# Patient Record
Sex: Female | Born: 1995 | Hispanic: Yes | Marital: Married | Smoking: Never smoker
Health system: Southern US, Community
[De-identification: ages and names within clinical notes are randomized; demographics above are authoritative.]

## PROBLEM LIST (undated history)

## (undated) DIAGNOSIS — K8020 Calculus of gallbladder without cholecystitis without obstruction: Secondary | ICD-10-CM

## (undated) DIAGNOSIS — Z789 Other specified health status: Secondary | ICD-10-CM

## (undated) HISTORY — DX: Other specified health status: Z78.9

## (undated) HISTORY — DX: Morbid (severe) obesity due to excess calories: E66.01

---
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
## 2017-01-15 LAB — OB RESULTS CONSOLE GC/CHLAMYDIA
Chlamydia: NEGATIVE
Gonorrhea: NEGATIVE

## 2017-01-15 LAB — OB RESULTS CONSOLE GBS: STREP GROUP B AG: NEGATIVE

## 2017-02-08 ENCOUNTER — Inpatient Hospital Stay (HOSPITAL_COMMUNITY)
Admission: AD | Admit: 2017-02-08 | Discharge: 2017-02-08 | Disposition: A | Discharge status: Home / Self Care | Payer: Self-pay | Source: Ambulatory Visit | Attending: Obstetrics and Gynecology | Admitting: Obstetrics and Gynecology

## 2017-02-08 ENCOUNTER — Encounter (HOSPITAL_COMMUNITY): Payer: Self-pay

## 2017-02-08 DIAGNOSIS — Z3A40 40 weeks gestation of pregnancy: Secondary | ICD-10-CM | POA: Insufficient documentation

## 2017-02-08 DIAGNOSIS — O479 False labor, unspecified: Secondary | ICD-10-CM | POA: Insufficient documentation

## 2017-02-08 NOTE — Discharge Instructions (Signed)
Contracciones de Braxton Hicks °(Braxton Hicks Contractions) °Durante el embarazo, pueden presentarse contracciones uterinas que no siempre indican que está en trabajo de parto. °¿QUÉ SON LAS CONTRACCIONES DE BRAXTON HICKS? °Las contracciones que se presentan antes del trabajo de parto se conocen como contracciones de Braxton Hicks o falso trabajo de parto. Hacia el final del embarazo (32 a 34 semanas), estas contracciones pueden aparecen con más frecuencia y volverse más intensas. No corresponden al trabajo de parto verdadero porque estas contracciones no producen el agrandamiento (la dilatación) y el afinamiento del cuello del útero. Algunas veces, es difícil distinguirlas del trabajo de parto verdadero porque en algunos casos pueden ser muy intensas, y las personas tienen diferentes niveles de tolerancia al dolor. No debe sentirse avergonzada si concurre al hospital con falso trabajo de parto. En ocasiones, la única forma de saber si el trabajo de parto es verdadero es que el médico determine si hay cambios en el cuello del útero. °Si no hay problemas prenatales u otras complicaciones de salud asociadas con el embarazo, no habrá inconvenientes si la envían a su casa con falso trabajo de parto y espera que comience el verdadero. °CÓMO DIFERENCIAR EL TRABAJO DE PARTO FALSO DEL VERDADERO °Falso trabajo de parto  °· Las contracciones del falso trabajo de parto duran menos y no son tan intensas como las verdaderas. °· Generalmente son irregulares. °· A menudo, se sienten en la parte delantera de la parte baja del abdomen y en la ingle, °· y pueden desaparecer cuando camina o cambia de posición mientras está acostada. °· Las contracciones se vuelven más débiles y su duración es menor a medida que el tiempo transcurre. °· Por lo general, no se hacen progresivamente más intensas, regulares y cercanas entre sí como en el caso del trabajo de parto verdadero. °Verdadero trabajo de parto  °· Las contracciones del verdadero  trabajo de parto duran de 30 a 70 segundos, son muy regulares y suelen volverse más intensas, y aumenta su frecuencia. °· No desaparecen cuando camina. °· La molestia generalmente se siente en la parte superior del útero y se extiende hacia la zona inferior del abdomen y hacia la cintura. °· El médico podrá examinarla para determinar si el trabajo de parto es verdadero. El examen mostrará si el cuello del útero se está dilatando y afinando. °LO QUE DEBE RECORDAR °· Continúe haciendo los ejercicios habituales y siga otras indicaciones que el médico le dé. °· Tome todos los medicamentos como le indicó el médico. °· Concurra a las visitas prenatales regulares. °· Coma y beba con moderación si cree que está en trabajo de parto. °· Si las contracciones de Braxton Hicks le provocan incomodidad: °¨ Cambie de posición: si está acostada o descansando, camine; si está caminando, descanse. °¨ Siéntese y descanse en una bañera con agua tibia. °¨ Beba 2 o 3 vasos de agua. La deshidratación puede provocar contracciones. °¨ Respire lenta y profundamente varias veces por hora. °¿CUÁNDO DEBO BUSCAR ASISTENCIA MÉDICA INMEDIATA? °Solicite atención médica de inmediato si: °· Las contracciones se intensifican, se hacen más regulares y cercanas entre sí. °· Tiene una pérdida de líquido por la vagina. °· Tiene fiebre. °· Elimina mucosidad manchada con sangre. °· Tiene una hemorragia vaginal abundante. °· Tiene dolor abdominal permanente. °· Tiene un dolor en la zona lumbar que nunca tuvo antes. °· Siente que la cabeza del bebé empuja hacia abajo y ejerce presión en la zona pélvica. °· El bebé no se mueve tanto como solía. °Esta información no tiene como fin reemplazar el   consejo del médico. Asegúrese de hacerle al médico cualquier pregunta que tenga. °Document Released: 09/03/2005 Document Revised: 03/17/2016 Document Reviewed: 09/05/2013 °Elsevier Interactive Patient Education © 2017 Elsevier Inc. ° °

## 2017-02-08 NOTE — MAU Note (Signed)
Pt presents to MAU with ctx. Reports ctx started about 1000 and got stronger at 1200. Denies any bleeding or leaking of fluid. reports good fetal movement.

## 2017-02-08 NOTE — Progress Notes (Signed)
I have communicated with Jasmine Decker CNM and reviewed vital signs:  Vitals:   02/08/17 1407 02/08/17 1519  BP: 130/77 128/75  Pulse: 99 90  Resp: 18 18  Temp: 97.7 F (36.5 C)     Vaginal exam:  Dilation: 1.5 Effacement (%): 50 Cervical Position: Posterior Station: Ballotable Exam by:: Guadlupe SpanishL. Qusay Villada RN ,   Also reviewed contraction pattern and that non-stress test is reactive.  It has been documented that patient is  Having irregular contractions with no cervical change over 1 hours not indicating active labor.  Patient denies any other complaints.  Based on this report provider has given order for discharge.  A discharge diagnosis will be entered by a provider. Labor discharge instructions reviewed with patient.

## 2017-02-13 ENCOUNTER — Telehealth (HOSPITAL_COMMUNITY): Payer: Self-pay | Admitting: *Deleted

## 2017-02-13 ENCOUNTER — Encounter (HOSPITAL_COMMUNITY): Payer: Self-pay

## 2017-02-13 ENCOUNTER — Encounter (HOSPITAL_COMMUNITY): Payer: Self-pay | Admitting: *Deleted

## 2017-02-13 ENCOUNTER — Inpatient Hospital Stay (HOSPITAL_COMMUNITY)
Admission: AD | Admit: 2017-02-13 | Discharge: 2017-02-16 | DRG: 775 | Disposition: A | Discharge status: Home / Self Care | Payer: Medicaid Other | Source: Ambulatory Visit | Attending: Obstetrics & Gynecology | Admitting: Obstetrics & Gynecology

## 2017-02-13 DIAGNOSIS — O99214 Obesity complicating childbirth: Secondary | ICD-10-CM | POA: Diagnosis present

## 2017-02-13 DIAGNOSIS — Z68.41 Body mass index (BMI) pediatric, 85th percentile to less than 95th percentile for age: Secondary | ICD-10-CM | POA: Diagnosis not present

## 2017-02-13 DIAGNOSIS — Z9049 Acquired absence of other specified parts of digestive tract: Secondary | ICD-10-CM

## 2017-02-13 DIAGNOSIS — O480 Post-term pregnancy: Secondary | ICD-10-CM | POA: Diagnosis present

## 2017-02-13 DIAGNOSIS — Z3A40 40 weeks gestation of pregnancy: Secondary | ICD-10-CM

## 2017-02-13 LAB — CBC
HEMATOCRIT: 33.9 % — AB (ref 36.0–46.0)
Hemoglobin: 10.6 g/dL — ABNORMAL LOW (ref 12.0–15.0)
MCH: 24.7 pg — ABNORMAL LOW (ref 26.0–34.0)
MCHC: 31.3 g/dL (ref 30.0–36.0)
MCV: 79 fL (ref 78.0–100.0)
Platelets: 501 10*3/uL — ABNORMAL HIGH (ref 150–400)
RBC: 4.29 MIL/uL (ref 3.87–5.11)
RDW: 15.9 % — AB (ref 11.5–15.5)
WBC: 11.1 10*3/uL — ABNORMAL HIGH (ref 4.0–10.5)

## 2017-02-13 LAB — ABO/RH: ABO/RH(D): A POS

## 2017-02-13 LAB — TYPE AND SCREEN
ABO/RH(D): A POS
Antibody Screen: NEGATIVE

## 2017-02-13 MED ORDER — MISOPROSTOL 25 MCG QUARTER TABLET
25.0000 ug | ORAL_TABLET | ORAL | Status: DC | PRN
  Administered 2017-02-13: 25 ug via VAGINAL
  Filled 2017-02-13: qty 1
  Filled 2017-02-13: qty 0.25

## 2017-02-13 MED ORDER — LIDOCAINE HCL (PF) 1 % IJ SOLN
30.0000 mL | INTRAMUSCULAR | Status: DC | PRN
  Administered 2017-02-14: 30 mL via SUBCUTANEOUS
  Filled 2017-02-13: qty 30

## 2017-02-13 MED ORDER — OXYCODONE-ACETAMINOPHEN 5-325 MG PO TABS: 2.0000 | ORAL_TABLET | ORAL | Status: DC | PRN

## 2017-02-13 MED ORDER — TERBUTALINE SULFATE 1 MG/ML IJ SOLN
0.2500 mg | Freq: Once | INTRAMUSCULAR | Status: DC | PRN
  Filled 2017-02-13: qty 1

## 2017-02-13 MED ORDER — OXYCODONE-ACETAMINOPHEN 5-325 MG PO TABS: 1.0000 | ORAL_TABLET | ORAL | Status: DC | PRN

## 2017-02-13 MED ORDER — FLEET ENEMA 7-19 GM/118ML RE ENEM: 1.0000 | ENEMA | Freq: Every day | RECTAL | Status: DC | PRN

## 2017-02-13 MED ORDER — ACETAMINOPHEN 325 MG PO TABS
650.0000 mg | ORAL_TABLET | ORAL | Status: DC | PRN
  Administered 2017-02-14 (×2): 650 mg via ORAL
  Filled 2017-02-13 (×3): qty 2

## 2017-02-13 MED ORDER — ONDANSETRON HCL 4 MG/2ML IJ SOLN
4.0000 mg | Freq: Four times a day (QID) | INTRAMUSCULAR | Status: DC | PRN
  Administered 2017-02-14 (×2): 4 mg via INTRAVENOUS
  Filled 2017-02-13 (×2): qty 2

## 2017-02-13 MED ORDER — OXYTOCIN 40 UNITS IN LACTATED RINGERS INFUSION - SIMPLE MED: 2.5000 [IU]/h | INTRAVENOUS | Status: DC

## 2017-02-13 MED ORDER — SOD CITRATE-CITRIC ACID 500-334 MG/5ML PO SOLN: 30.0000 mL | ORAL | Status: DC | PRN

## 2017-02-13 MED ORDER — LACTATED RINGERS IV SOLN
INTRAVENOUS | Status: DC
  Administered 2017-02-13 – 2017-02-14 (×4): via INTRAVENOUS

## 2017-02-13 MED ORDER — OXYTOCIN BOLUS FROM INFUSION
500.0000 mL | Freq: Once | INTRAVENOUS | Status: AC
  Administered 2017-02-14: 500 mL via INTRAVENOUS

## 2017-02-13 MED ORDER — LACTATED RINGERS IV SOLN
500.0000 mL | INTRAVENOUS | Status: DC | PRN
  Administered 2017-02-14: 500 mL via INTRAVENOUS
  Administered 2017-02-14: 250 mL via INTRAVENOUS

## 2017-02-13 NOTE — Telephone Encounter (Signed)
Preadmission screen Interpreter number [phone]

## 2017-02-13 NOTE — H&P (Signed)
Lashan Mckinley Jewel is a 21 y.o. female G1P0 with IUP at [redacted]w[redacted]d presenting for NST after [facility]. Patient is scheduled for post dates induction tomorrow, March 10. Pt states she has been having irregular, every 5 minutes contractions, associated with none vaginal bleeding all day.  Membranes are intact, with active fetal movement.   [facility]  [facility]  Prenatal History/Complications:  Past Medical History: Past Medical History:  Diagnosis Date  . Gall stones     Past Surgical History: Past Surgical History:  Procedure Laterality Date  . CHOLECYSTECTOMY N/A 01/13/2015   Procedure: LAPAROSCOPIC CHOLECYSTECTOMY WITH POSSIBLE INTRAOPERATIVE CHOLANGIOGRAM;  Surgeon: Abigail Miyamoto, MD;  Location: [facility] OR;  Service: General;  Laterality: N/A;    Obstetrical History: OB History    Gravida Para Term Preterm AB Living   1             SAB TAB Ectopic Multiple Live Births                   Social History: Social History   Social History  . Marital status: Single    Spouse name: N/A  . Number of children: N/A  . Years of education: N/A   Social History Main Topics  . Smoking status: Never Smoker  . Smokeless tobacco: Never Used     Comment: Brother does smoke around the home, patient denies smoking/drugs/alcohol  . Alcohol use No  . Drug use: No  . Sexual activity: Yes     Comment: LMP 10/05/13   Other Topics Concern  . None   Social History Narrative   Lives at home with 38 year old brother, mother, no pets.  Patient's only hospital visit includes a trip to the ED for decreased po intake and swelling in her throat, no admission.  This was at 21 years of age.    Family History: Family History  Problem Relation Age of Onset  . Gallbladder disease Maternal Aunt     3 aunts have had gallbladder removal    Allergies: Allergies  Allergen Reactions  . Tylenol [Acetaminophen]     Liver concerns    Prescriptions Prior to Admission   Medication Sig Dispense Refill Last Dose  . Prenatal Vit-Fe Fumarate-FA (PRENATAL MULTIVITAMIN) TABS tablet Take 1 tablet by mouth daily at 12 noon.   02/08/2017 at Unknown time        Review of Systems   Constitutional: Negative for fever and chills Eyes: Negative for visual disturbances Respiratory: Negative for shortness of breath, dyspnea Cardiovascular: Negative for chest pain or palpitations  Gastrointestinal: Negative for vomiting, diarrhea and constipation.  POSITIVE for abdominal pain (contractions) Genitourinary: Negative for dysuria and urgency Musculoskeletal: Negative for back pain, joint pain, myalgias  Neurological: Negative for dizziness and headaches      Blood pressure 123/74, pulse 102, temperature 98 F (36.7 C), temperature source Oral, resp. rate 18, last menstrual period 05/17/2016, SpO2 100 %. General appearance: alert, cooperative and no distress Lungs: clear to auscultation bilaterally Heart: regular rate and rhythm Abdomen: soft, non-tender; bowel sounds normal Extremities: Homans sign is negative, no sign of DVT DTR's 2+ Presentation: cephalic Fetal monitoring  Baseline: 150 bpm; present accels, occasional variables with contractions, moderate variability.  Uterine activity  Date/time of onset: 0600, mild (6/10),  Cervical exam: 2-3; 50%, midline     Prenatal labs: ABO, Rh: A/Positive/-- (09/11 0000) Antibody: Negative (09/11 0000) Rubella: !Error! UNKNOWN RPR: Nonreactive (09/11 0000)  HBsAg: Negative (  09/11 0000)  HIV: Non-reactive (09/11 0000)  GBS: Negative (02/08 0000)  1 hr Glucola passed Genetic screening  declined Anatomy US normal  Prenatal Transfer Tool  Maternal Diabetes: No Genetic Screening: Declined Maternal Ultrasounds/Referrals: Normal Fetal Ultrasounds or other Referrals:  None Maternal Substance Abuse:  No Significant Maternal Medications:  None Significant Maternal Lab Results: None     No results found for  this or any previous visit (from the past 24 hour(s)).  Assessment: Breta Mckinley JewelGarcia-Sanchez is a 21 y.o. G1P0 with an IUP at 9392w6d [facility] She was found to be 2-3 cm in MAu with variable decelerations. Given that patient is scheduled for postdates induction, patient to be admitted for labor.   Plan: #Labor: expectant management #Pain:  Per request epidural #FWB Cat 1;  #ID: GBS: negative #MOF:  breast #MOC: Depo #Circ: female   Marylene LandKathryn Lorraine Sharita Bienaime  CNM 02/13/2017, 3:24 PM

## 2017-02-13 NOTE — Progress Notes (Signed)
Residents on unit.  Updated on pt status.  No new orders.

## 2017-02-13 NOTE — Progress Notes (Signed)
S:  Patient comfortable with current contractions   O:  VS: Blood pressure 122/65, pulse 84, temperature 98.2 F (36.8 C), temperature source Oral, resp. rate 18, height 5\' 2"  (1.575 m), weight 102.1 kg (225 lb), last menstrual period 05/17/2016, SpO2 100 %.        FHR : baseline 130 / variability mod / accelerations present / no decelerations        Toco: contractions every 4-6 minutes / mod         Dilation: 1.5 Effacement (%): 60 Station: -2 Presentation: Vertex Exam by:: Veronica Rogers,Student Midwife         Membranes: intact  A: IOL postdates      FHR category 1  P: Foley bulb placed, continue cytotec doses      Changed to regular diet     Ambien to sleep tonight     Steward DroneVeronica Rogers BSN, SNM 02/13/2017, 10:09 PM   I have seen and examined this patient and I agree with the above. Cam HaiSHAW, Margurete Guaman CNM 12:30 AM 02/14/2017

## 2017-02-13 NOTE — Anesthesia Pain Management Evaluation Note (Signed)
  CRNA Pain Management Visit Note  Patient: Jasmine Decker, 21 y.o., female  "Hello I am a member of the anesthesia team at [facility]. We have an anesthesia team available at all times to provide care throughout the hospital, including epidural management and anesthesia for C-section. I don't know your plan for the delivery whether it a natural birth, water birth, IV sedation, nitrous supplementation, doula or epidural, but we want to meet your pain goals."   1.Was your pain managed to your expectations on prior hospitalizations?   No prior hospitalizations  2.What is your expectation for pain management during this hospitalization?     Epidural  3.How can we help you reach that goal? Epidural when possible.  Record the patient's initial score and the patient's pain goal.   Pain: 6  Pain Goal: 0 [facility] wants you to be able to say your pain was always managed very well.  Hodaya Curto 02/13/2017

## 2017-02-14 ENCOUNTER — Inpatient Hospital Stay (HOSPITAL_COMMUNITY): Payer: Medicaid Other | Admitting: Anesthesiology

## 2017-02-14 ENCOUNTER — Encounter (HOSPITAL_COMMUNITY): Payer: Self-pay | Admitting: Anesthesiology

## 2017-02-14 DIAGNOSIS — Z3A40 40 weeks gestation of pregnancy: Secondary | ICD-10-CM

## 2017-02-14 LAB — RPR: RPR Ser Ql: NONREACTIVE

## 2017-02-14 MED ORDER — ACETAMINOPHEN 500 MG PO TABS
1000.0000 mg | ORAL_TABLET | Freq: Once | ORAL | Status: AC
  Administered 2017-02-14: 325 mg via ORAL

## 2017-02-14 MED ORDER — GENTAMICIN SULFATE 40 MG/ML IJ SOLN
140.0000 mg | Freq: Three times a day (TID) | INTRAVENOUS | Status: DC
  Administered 2017-02-14: 140 mg via INTRAVENOUS
  Filled 2017-02-14 (×2): qty 3.5

## 2017-02-14 MED ORDER — ACETAMINOPHEN 325 MG PO TABS: 650.0000 mg | ORAL_TABLET | ORAL | Status: DC | PRN

## 2017-02-14 MED ORDER — LIDOCAINE HCL (PF) 1 % IJ SOLN
INTRAMUSCULAR | Status: DC | PRN
  Administered 2017-02-14 (×2): 4 mL via EPIDURAL

## 2017-02-14 MED ORDER — EPHEDRINE 5 MG/ML INJ
10.0000 mg | INTRAVENOUS | Status: DC | PRN
  Filled 2017-02-14: qty 4

## 2017-02-14 MED ORDER — SIMETHICONE 80 MG PO CHEW: 80.0000 mg | CHEWABLE_TABLET | ORAL | Status: DC | PRN

## 2017-02-14 MED ORDER — TETANUS-DIPHTH-ACELL PERTUSSIS 5-2.5-18.5 LF-MCG/0.5 IM SUSP: 0.5000 mL | Freq: Once | INTRAMUSCULAR | Status: DC

## 2017-02-14 MED ORDER — ONDANSETRON HCL 4 MG PO TABS: 4.0000 mg | ORAL_TABLET | ORAL | Status: DC | PRN

## 2017-02-14 MED ORDER — WITCH HAZEL-GLYCERIN EX PADS: 1.0000 "application " | MEDICATED_PAD | CUTANEOUS | Status: DC | PRN

## 2017-02-14 MED ORDER — ONDANSETRON HCL 4 MG/2ML IJ SOLN: 4.0000 mg | INTRAMUSCULAR | Status: DC | PRN

## 2017-02-14 MED ORDER — ZOLPIDEM TARTRATE 5 MG PO TABS: 5.0000 mg | ORAL_TABLET | Freq: Every evening | ORAL | Status: DC | PRN

## 2017-02-14 MED ORDER — DIPHENHYDRAMINE HCL 25 MG PO CAPS: 25.0000 mg | ORAL_CAPSULE | Freq: Four times a day (QID) | ORAL | Status: DC | PRN

## 2017-02-14 MED ORDER — PRENATAL MULTIVITAMIN CH
1.0000 | ORAL_TABLET | Freq: Every day | ORAL | Status: DC
  Administered 2017-02-15: 1 via ORAL
  Filled 2017-02-14: qty 1

## 2017-02-14 MED ORDER — LACTATED RINGERS IV SOLN
INTRAVENOUS | Status: DC
  Administered 2017-02-14: 11:00:00 via INTRAUTERINE

## 2017-02-14 MED ORDER — PHENYLEPHRINE 40 MCG/ML (10ML) SYRINGE FOR IV PUSH (FOR BLOOD PRESSURE SUPPORT)
80.0000 ug | PREFILLED_SYRINGE | INTRAVENOUS | Status: DC | PRN
  Filled 2017-02-14: qty 5
  Filled 2017-02-14: qty 10

## 2017-02-14 MED ORDER — IBUPROFEN 600 MG PO TABS
600.0000 mg | ORAL_TABLET | Freq: Four times a day (QID) | ORAL | Status: DC
  Administered 2017-02-14 – 2017-02-16 (×6): 600 mg via ORAL
  Filled 2017-02-14 (×6): qty 1

## 2017-02-14 MED ORDER — SODIUM CHLORIDE 0.9 % IV SOLN
2.0000 g | Freq: Four times a day (QID) | INTRAVENOUS | Status: DC
  Administered 2017-02-14: 2 g via INTRAVENOUS
  Filled 2017-02-14 (×3): qty 2000

## 2017-02-14 MED ORDER — FENTANYL CITRATE (PF) 100 MCG/2ML IJ SOLN
100.0000 ug | INTRAMUSCULAR | Status: DC | PRN
Start: 2017-02-14 — End: 2017-02-14
  Administered 2017-02-14: 100 ug via INTRAVENOUS
  Filled 2017-02-14: qty 2

## 2017-02-14 MED ORDER — DIBUCAINE 1 % RE OINT: 1.0000 "application " | TOPICAL_OINTMENT | RECTAL | Status: DC | PRN

## 2017-02-14 MED ORDER — BENZOCAINE-MENTHOL 20-0.5 % EX AERO
1.0000 "application " | INHALATION_SPRAY | CUTANEOUS | Status: DC | PRN
  Administered 2017-02-15: 1 via TOPICAL
  Filled 2017-02-14: qty 56

## 2017-02-14 MED ORDER — DIPHENHYDRAMINE HCL 50 MG/ML IJ SOLN: 12.5000 mg | INTRAMUSCULAR | Status: DC | PRN

## 2017-02-14 MED ORDER — OXYTOCIN 40 UNITS IN LACTATED RINGERS INFUSION - SIMPLE MED
1.0000 m[IU]/min | INTRAVENOUS | Status: DC
Start: 2017-02-14 — End: 2017-02-14
  Administered 2017-02-14: 2 m[IU]/min via INTRAVENOUS
  Filled 2017-02-14: qty 1000

## 2017-02-14 MED ORDER — LACTATED RINGERS IV SOLN: 500.0000 mL | Freq: Once | INTRAVENOUS | Status: DC

## 2017-02-14 MED ORDER — SENNOSIDES-DOCUSATE SODIUM 8.6-50 MG PO TABS
2.0000 | ORAL_TABLET | ORAL | Status: DC
Start: 2017-02-15 — End: 2017-02-16
  Administered 2017-02-14 – 2017-02-16 (×2): 2 via ORAL
  Filled 2017-02-14 (×2): qty 2

## 2017-02-14 MED ORDER — COCONUT OIL OIL
1.0000 "application " | TOPICAL_OIL | Status: DC | PRN
  Administered 2017-02-15: 1 via TOPICAL
  Filled 2017-02-14: qty 120

## 2017-02-14 MED ORDER — FENTANYL 2.5 MCG/ML BUPIVACAINE 1/10 % EPIDURAL INFUSION (WH - ANES)
14.0000 mL/h | INTRAMUSCULAR | Status: DC | PRN
  Administered 2017-02-14: 14 mL/h via EPIDURAL
  Administered 2017-02-14: 12 mL/h via EPIDURAL
  Filled 2017-02-14 (×3): qty 100

## 2017-02-14 NOTE — Progress Notes (Signed)
Patient comfortable with epidural   Dilation: 6 Effacement (%): 80 Station: -1 Presentation: Vertex Exam by:: Sherrell PullerSade Harper,RN  IOL for postdates  Active labor - progressing well    Pitocin discontinued for decelerations Expectant management  Possible AROM at next cervical check   I have seen and examined this patient and I agree with the above note by Steward DroneVeronica Rogers, SNM. Cam HaiSHAW, Aixa Corsello CNM 10:53 PM 02/18/2017

## 2017-02-14 NOTE — Lactation Note (Signed)
This note was copied from a baby's chart. Lactation Consultation Note  Patient Name: [name] ZOXWR'UToday's Date: 02/14/2017 Reason for consult: Initial assessment Baby at 4 hr of life. Mom is worried because baby has not latched since birth. Baby has a nice gape but a tight jaw. Baby curls tongue back into the mouth. Baby has a high palate. With a lot of effort baby was finally able to extend tongue and suck on a gloved finger. When baby did start to suck she had good peristolic movement and nice lateralization of tongue. Mom has short shaft soft breast. Applied #20 NS and baby was able to maintain long bursts of sucking. Given Harmony to post pump. Taught suck training. Discussed baby behavior, feeding frequency, baby belly size, voids, wt loss, breast changes, and nipple care. Demonstrated manual expression, colostrum noted bilaterally, spoon in room. Given lactation handouts. Aware of OP services and support group. Mom will bf on demand, post pump, offer expressed milk with a spoon, baby will have tummy time between feedings, and mom will do suck train between feedings.      Maternal Data Has patient been taught Hand Expression?: Yes Does the patient have breastfeeding experience prior to this delivery?: No  Feeding Feeding Type: Breast Fed Length of feed: 10 min  LATCH Score/Interventions Latch: Repeated attempts needed to sustain latch, nipple held in mouth throughout feeding, stimulation needed to elicit sucking reflex. Intervention(s): Assist with latch;Adjust position  Audible Swallowing: A few with stimulation Intervention(s): Skin to skin;Hand expression  Type of Nipple: Everted at rest and after stimulation  Comfort (Breast/Nipple): Soft / non-tender     Hold (Positioning): Full assist, staff holds infant at breast Intervention(s): Position options  LATCH Score: 6  Lactation Tools Discussed/Used Tools: Nipple Dorris CarnesShields;Pump Nipple shield size: 20 Breast  pump type: Manual WIC Program: Yes Pump Review: Setup, frequency, and cleaning;Milk Storage Initiated by:: ES Date initiated:: 02/14/17   Consult Status Consult Status: Follow-up Date: 02/15/17 Follow-up type: In-patient    Jasmine Decker 02/14/2017, 8:36 PM

## 2017-02-14 NOTE — Progress Notes (Signed)
comfortable w/ epidural  O: BP 117/63   Pulse 91   Temp 98.6 F (37 C) (Oral)   Resp 18   Ht 5\' 2"  (1.575 m)   Wt 225 lb (102.1 kg)   LMP 05/17/2016 (Approximate)   SpO2 100%   BMI 41.15 kg/m  Fetal monitoring: good baseline and moderate variability. After AROM (moderate meconium), fetus had some variable decels and eventual late decels. Baseline 130.  SVE: 7-8/90/-1 after AROM.  AROM: moderate meconioum, sterile gloves all time, moderate amount of fluid, no cord prolapse  A: some decels after AROM, improving with measures  P: stop Pitocin, fluid bolus, lateral decubitus - left; IUPC placed, amnioinfusion started   Adair LaundryAna Carvalho do Amaral MD PGY1

## 2017-02-14 NOTE — Progress Notes (Signed)
Pharmacy Antibiotic Note  Jasmine Decker is a 21 y.o. female admitted on 02/13/2017 for IOL .  Pharmacy has been consulted for gentamicin dosing for suspected Triple I due to new onset of fever.  Plan: Gentamicin 140 mg IV Q 8 hr If treatment continues postpartum, will order SCr  Height: 5\' 2"  (157.5 cm) Weight: 225 lb (102.1 kg) IBW/kg (Calculated) : 50.1kg Dosing Wt: 66 kg  Temp (24hrs), Avg:98.7 F (37.1 C), Min:97.8 F (36.6 C), Max:101.1 F (38.4 C)   Recent Labs Lab 02/13/17 1520  WBC 11.1*    CrCl cannot be calculated (Patient's most recent lab result is older than the maximum 21 days allowed.).  - used an estimated CrCl of 103 ml/min (used estimated SCr of 0.7)  No Known Allergies  Antimicrobials this admission: Ampicillin  3/10 >>  Gentamicin  3/10 >>    Thank you for allowing pharmacy to be a part of this patient's care.  Natasha BenceCline, Brendalee Matthies 02/14/2017 11:31 AM

## 2017-02-14 NOTE — Progress Notes (Signed)
Sleeping  Fetal monitoring: baseline 145, mod variability, acels +, no decels  SVE: 8/80/0 BP 98/75   Pulse (!) 102   Temp 99.8 F (37.7 C) (Axillary)   Resp 17   Ht 5\' 2"  (1.575 m)   Wt 225 lb (102.1 kg)   LMP 05/17/2016 (Approximate)   SpO2 100%   BMI 41.15 kg/m   A Triple A  P Pen + Gen started already Progressing towards vaginal delivery  Nehemiah SettleAna Carvalho do Silvio ClaymanAmaral MD PGY1

## 2017-02-14 NOTE — Progress Notes (Signed)
Patient is febrile, 101F,   P: start pen + gent Tylenol 1g Already received fluid boluses  Adair LaundryAna Carvalho do Amaral, MD

## 2017-02-14 NOTE — Anesthesia Preprocedure Evaluation (Signed)
Anesthesia Evaluation  Patient identified by MRN, date of birth, ID band Patient awake    Reviewed: Allergy & Precautions, NPO status , Patient's Chart, lab work & pertinent test results  Airway Mallampati: III  TM Distance: >3 FB Neck ROM: Full    Dental no notable dental hx. (+) Teeth Intact   Pulmonary neg pulmonary ROS,    Pulmonary exam normal breath sounds clear to auscultation       Cardiovascular negative cardio ROS Normal cardiovascular exam Rhythm:Regular Rate:Normal     Neuro/Psych  Headaches, PSYCHIATRIC DISORDERS    GI/Hepatic Neg liver ROS, GERD  ,  Endo/Other  Morbid obesity  Renal/GU negative Renal ROS  negative genitourinary   Musculoskeletal negative musculoskeletal ROS (+)   Abdominal (+) + obese,   Peds  Hematology  (+) anemia ,   Anesthesia Other Findings   Reproductive/Obstetrics (+) Pregnancy                             Anesthesia Physical Anesthesia Plan  ASA: III  Anesthesia Plan: Epidural   Post-op Pain Management:    Induction:   Airway Management Planned: Natural Airway  Additional Equipment:   Intra-op Plan:   Post-operative Plan:   Informed Consent: I have reviewed the patients History and Physical, chart, labs and discussed the procedure including the risks, benefits and alternatives for the proposed anesthesia with the patient or authorized representative who has indicated his/her understanding and acceptance.     Plan Discussed with: Anesthesiologist  Anesthesia Plan Comments:         Anesthesia Quick Evaluation

## 2017-02-14 NOTE — Anesthesia Procedure Notes (Signed)
Epidural Patient location during procedure: OB Start time: 02/14/2017 2:24 AM  Staffing Anesthesiologist: Mal AmabileFOSTER, Alyss Granato Performed: anesthesiologist   Preanesthetic Checklist Completed: patient identified, site marked, surgical consent, pre-op evaluation, timeout performed, IV checked, risks and benefits discussed and monitors and equipment checked  Epidural Patient position: sitting Prep: site prepped and draped and DuraPrep Patient monitoring: continuous pulse ox and blood pressure Approach: midline Location: L3-L4 Injection technique: LOR air  Needle:  Needle type: Tuohy  Needle gauge: 17 G Needle length: 9 cm and 9 Needle insertion depth: 7 cm Catheter type: closed end flexible Catheter size: 19 Gauge Catheter at skin depth: 12 cm Test dose: negative and Other  Assessment Events: blood not aspirated, injection not painful, no injection resistance, negative IV test and no paresthesia  Additional Notes Patient identified. Risks and benefits discussed including failed block, incomplete  Pain control, post dural puncture headache, nerve damage, paralysis, blood pressure Changes, nausea, vomiting, reactions to medications-both toxic and allergic and post Partum back pain. All questions were answered. Patient expressed understanding and wished to proceed. Sterile technique was used throughout procedure. Epidural site was Dressed with sterile barrier dressing. No paresthesias, signs of intravascular injection Or signs of intrathecal spread were encountered.  Patient was more comfortable after the epidural was dosed. Please see RN's note for documentation of vital signs and FHR which are stable.

## 2017-02-15 ENCOUNTER — Inpatient Hospital Stay (HOSPITAL_COMMUNITY): Admission: RE | Admit: 2017-02-15 | Payer: Self-pay | Source: Ambulatory Visit

## 2017-02-15 NOTE — Progress Notes (Signed)
Post Partum Day #1 Subjective: no complaints, up ad lib, voiding and tolerating PO, normal bleeding  Objective: Blood pressure 113/70, pulse 90, temperature 98 F (36.7 C), temperature source Oral, resp. rate 20, height 5\' 2"  (1.575 m), weight 102.1 kg (225 lb), last menstrual period 05/17/2016, SpO2 100 %, unknown if currently breastfeeding.  Physical Exam:  General: alert Lochia: appropriate Uterine Fundus: firm and NT at U-1 DVT Evaluation: No evidence of DVT seen on physical exam.   Recent Labs  02/13/17 1520  HGB 10.6*  HCT 33.9*    Assessment/Plan: Plan for discharge tomorrow   LOS: 2 days   Jasmine BossierMyra C Hadassah Decker 02/15/2017, 8:36 AM

## 2017-02-15 NOTE — Progress Notes (Signed)
POSTPARTUM PROGRESS NOTE  Post Partum Day 1 Subjective:  Jasmine Decker is a 21 y.o. G1P1001 5173w0d s/p SVD.  No acute events overnight.  Pt denies problems with ambulating, voiding or po intake.  She denies nausea or vomiting.  Pain is well controlled.  She has had flatus. She has not had bowel movement.  Lochia Small.  Triple I. Amp + gent during labor Objective: Blood pressure 113/70, pulse 90, temperature 98 F (36.7 C), temperature source Oral, resp. rate 20, height 5\' 2"  (1.575 m), weight 225 lb (102.1 kg), last menstrual period 05/17/2016, SpO2 100 %, unknown if currently breastfeeding. Afebrile since delivery Physical Exam:  General: alert, cooperative and no distress Lochia:normal flow Chest: no respiratory distress Heart:regular rate, distal pulses intact Abdomen: soft, nontender,  Uterine Fundus: firm, appropriately tender DVT Evaluation: No calf swelling or tenderness Extremities: no edema   Recent Labs  02/13/17 1520  HGB 10.6*  HCT 33.9*    Assessment/Plan:  ASSESSMENT: Jasmine Decker is a 21 y.o. G1P1001 5373w0d s/p SVD  Plan for discharge tomorrow   LOS: 2 days   Adair LaundryAna Carvalho do Amaral MD PGY1 02/15/2017, 7:44 AM
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
## 2017-02-15 NOTE — Anesthesia Postprocedure Evaluation (Signed)
Anesthesia Post Note  Patient: Jasmine Decker  Procedure(s) Performed: * No procedures listed *  Patient location during evaluation: Mother Baby Anesthesia Type: Epidural Level of consciousness: awake, awake and alert, oriented and patient cooperative Pain management: pain level controlled Vital Signs Assessment: post-procedure vital signs reviewed and stable Respiratory status: spontaneous breathing, nonlabored ventilation and respiratory function stable Cardiovascular status: stable Postop Assessment: no headache, no backache, patient able to bend at knees and no signs of nausea or vomiting Anesthetic complications: no        Last Vitals:  Vitals:   02/14/17 2328 02/15/17 0538  BP: 118/60 113/70  Pulse: (!) 103 90  Resp: 18 20  Temp: 37.1 C 36.7 C    Last Pain:  Vitals:   02/15/17 0541  TempSrc:   PainSc: 0-No pain   Pain Goal: Patients Stated Pain Goal: 0 (02/13/17 1446)               Aletha Allebach L

## 2017-02-15 NOTE — Lactation Note (Signed)
This note was copied from a baby's chart. Lactation Consultation Note  Patient Name: Jasmine Dellia CloudMitzy Garcia-Sanchez ZOXWR'UToday's Date: 02/15/2017 Reason for consult: Follow-up assessment Baby at 23 hr of life. Upon entry baby was sleeping in visitors arms. Mom reports baby "just got done eating". RN stated baby was sleepy at the breast but Mom reports baby is starting to wake more. Mom reports bilateral nipple soreness. No skin break down or bruising at this time. Mom has not been post pumping. Discussed baby behavior, feeding frequency, pumping, supplementing, voids, wt loss, and nipple care. Mom will offer the breast on demand, keep baby awake and feeding for at least 10 minutes, then post pump. She will offer her expressed milk per volume guidelines. Parents are aware of lactation services and support group.    Maternal Data    Feeding Feeding Type: Breast Fed Length of feed: 10 min  LATCH Score/Interventions                      Lactation Tools Discussed/Used Tools: Nipple Shields Nipple shield size: 20 Breast pump type: Manual   Consult Status Consult Status: Follow-up Date: 02/16/17 Follow-up type: In-patient    Rulon Eisenmengerlizabeth E Tracey Stewart 02/15/2017, 3:33 PM

## 2017-02-16 ENCOUNTER — Ambulatory Visit: Payer: Self-pay

## 2017-02-16 MED ORDER — IBUPROFEN 600 MG PO TABS: 600.0000 mg | ORAL_TABLET | Freq: Four times a day (QID) | ORAL | 0 refills | Status: DC

## 2017-02-16 MED ORDER — MEDROXYPROGESTERONE ACETATE 150 MG/ML IM SUSP
150.0000 mg | Freq: Once | INTRAMUSCULAR | Status: AC
  Administered 2017-02-16: 150 mg via INTRAMUSCULAR
  Filled 2017-02-16: qty 1

## 2017-02-16 NOTE — Lactation Note (Signed)
This note was copied from a baby's chart. Lactation Consultation Note  Patient Name: Jasmine Dellia CloudMitzy Garcia-Sanchez ZOXWR'UToday's Date: 02/16/2017 Reason for consult: Follow-up assessment;Hyperbilirubinemia   Follow up with mom of 44 hour old infant. Spoke with [employer]da Royal, [facility] Interpreter,  who informed me mom is [place] speaking and does not need interpreter, Mom declined interpreter when I entered room. Infant under phototherapy. Mom reports she is experiencing pain to nipples post BF and notes nipples are pinched looking when infant comes off. Infant last ate between 11 and 12. Left LC phone # and asked mom to call out for next feeding. Mom denies feeling fuller today. Mom reports infant has not stooled and was MSF at birth. Mom without questions/concerns at this time.    Maternal Data Formula Feeding for Exclusion: No Does the patient have breastfeeding experience prior to this delivery?: No  Feeding Feeding Type: Breast Fed Length of feed: 10 min  LATCH Score/Interventions                      Lactation Tools Discussed/Used     Consult Status Consult Status: Follow-up Date: 02/16/17 Follow-up type: In-patient    Silas FloodSharon S Alonzo Loving 02/16/2017, 1:14 PM

## 2017-02-16 NOTE — Lactation Note (Signed)
This note was copied from a baby's chart. Lactation Consultation Note  Patient Name: Jasmine Decker DBZMC'E Date: 02/16/2017 Reason for consult: Follow-up assessment (to have [facility] referral completed and was informed baby was having Single Photo D/C and was for D/C )  Baby is 38 hours old, per mom recently breastfeed and supplemented with EBM she had pumped.  LC reviewed sore nipple and engorgement prevention and tx.  Mom has  A hand pump and the DEBP Kit. LC offer a [place] Loaner until she can obtain her WIC pump. Mom declined and requested to call [facility] tomorrow and see if she could pick a DEBP up  Before her Pedis appt. Kennieth Rad mentioned to mom the [facility] referral for DEBp was faxed and it would be a good idea to call and leave a message, so [place] would call her back early.  LC also mentioned to mom if [facility] did not have a DEBP available to call [facility] office for a [facility] loaner.  Mom receptive to coming back for [facility] O/P appt Friday 3/16 at 2:3- pm. Appt. Reminder given to mom with instructions on what to bring.  LC stressed the importance of the post pumping to protect establishing milk supply.  And until she has her DEBP - to use her hand pump and pre - pump every feeding , and also add post pump , especially if the baby only feeds on one breast.     Maternal Data Formula Feeding for Exclusion: No Has patient been taught Hand Expression?: Yes Does the patient have breastfeeding experience prior to this delivery?: No  Feeding Feeding Type: Breast Milk Length of feed: 30 min  LATCH Score/Interventions Latch: Repeated attempts needed to sustain latch, nipple held in mouth throughout feeding, stimulation needed to elicit sucking reflex. Intervention(s): Adjust position;Assist with latch;Breast massage;Breast compression  Audible Swallowing: A few with stimulation Intervention(s): Skin to skin;Hand expression;Alternate breast massage  Type of Nipple: Everted at rest and after stimulation  Comfort  (Breast/Nipple): Filling, red/small blisters or bruises, mild/mod discomfort  Problem noted: Mild/Moderate discomfort  Hold (Positioning): Assistance needed to correctly position infant at breast and maintain latch. Intervention(s): Breastfeeding basics reviewed (see LC note )  LATCH Score: 6  Lactation Tools Discussed/Used Tools: Bottle Nipple shield size: 20;24 Breast pump type: Double-Electric Breast Pump WIC Program: Yes [facility] referral for a DEBP faxed , mom aware and aware to call [facility] for appt. ) Pump Review: Setup, frequency, and cleaning;Milk Storage Initiated by:: Margy Clarks, RN, IBCLC Date initiated:: 02/16/17   Consult Status Consult Status: Follow-up Date: 02/20/17 (3/16 at 2:30 , appt. reminder sheet given to mom with instruction ) Follow-up type: [place] 02/16/2017, 5:12 PM

## 2017-02-16 NOTE — Discharge Summary (Signed)
Obstetric Discharge Summary Reason for Admission: [redacted] weeks EGA IOL  Prenatal Procedures: NST and ultrasound Intrapartum Procedures: spontaneous vaginal delivery Postpartum Procedures: none Complications-Operative and Postpartum: 1st degree perineal laceration Hemoglobin  Date Value Ref Range Status  02/13/2017 10.6 (L) 12.0 - 15.0 g/dL Final   HCT  Date Value Ref Range Status  02/13/2017 33.9 (L) 36.0 - 46.0 % Final    Physical Exam:  General: alert Lochia: appropriate Uterine Fundus: firm and NT at U-1 DVT Evaluation: No evidence of DVT seen on physical exam.  Discharge Diagnoses: Term Pregnancy-delivered  Discharge Information: Date: 02/16/2017 Activity: pelvic rest Diet: routine Medications: PNV, Ibuprofen and depo provera prior to discharge, plans Nexplanon at postpartum visit. Condition: stable Instructions: refer to practice specific booklet Discharge to: home [facility]-up Information    [facility] Schedule an appointment as soon as possible for a visit in 6 week(s).   Why:  Tell them you want a Nexplanon. Contact information: [address] [place] [address] [phone]           Newborn Data: Live born female  Birth Weight: 7 lb 9.6 oz (3447 g) APGAR: 8, 9  Home with mother with mother pending peds approval and bilirubin level.  Allie BossierMyra C Asra Gambrel 02/16/2017, 6:40 AM

## 2017-02-16 NOTE — Lactation Note (Signed)
This note was copied from a baby's chart. Lactation Consultation Note  Patient Name: Jasmine Decker HQION'GToday's Date: 02/16/2017 Reason for consult: Follow-up assessment;Difficult latch   Mom called out for feeding assistance. Infant awake and alert. Mom applied # 20 NS to left breast and latched infant to breast in the football hold. Infant latched easily and fell asleep pretty quickly. Worked with mom in awakening techniques and infant would feed for 15 minutes. Colostrum noted in NS post BF. Mom reports pain/burning to base of nipple.   Changed mom to # 24 NS to latch infant to right breast in the football hold. Infant fed for 10 minutes and fell asleep. Hand expressed mom and obtained 4 cc colostrum that was spoon fed to infant. Infant then wide awake and wanted to relatch. Mom applied # 24 NS and infant latched back to breast Infant still feeding when I left the room.   DEBP set up with instructions for pumping on Initiate setting, assembling, disassembling and cleaning of pump parts. Plan made with mom and written on board. Discussed with mom keeping bili blanket on infant during feeding.   1. Breast feed infant at first feeding cues, at least every 3 hours using # 24 NS 2. Supplement infant with any EBM available by spoon or curved tip syringe, supplement prior to latch if infant sleepy 3. Pump for 15 minutes on Initiate setting with DEBP 4. Hand express post pumping 5. Call for assistance as needed.   Report to Jasmine HayJessica Reynolds, RN.    Maternal Data Formula Feeding for Exclusion: No Has patient been taught Hand Expression?: Yes Does the patient have breastfeeding experience prior to this delivery?: No  Feeding Feeding Type: Breast Fed Length of feed: 25 min  LATCH Score/Interventions Latch: Repeated attempts needed to sustain latch, nipple held in mouth throughout feeding, stimulation needed to elicit sucking reflex. Intervention(s): Adjust position;Assist with  latch;Breast massage;Breast compression  Audible Swallowing: A few with stimulation Intervention(s): Skin to skin;Hand expression;Alternate breast massage  Type of Nipple: Everted at rest and after stimulation  Comfort (Breast/Nipple): Filling, red/small blisters or bruises, mild/mod discomfort  Problem noted: Mild/Moderate discomfort  Hold (Positioning): Assistance needed to correctly position infant at breast and maintain latch. Intervention(s): Breastfeeding basics reviewed;Support Pillows;Position options;Skin to skin  LATCH Score: 6  Lactation Tools Discussed/Used Tools: Nipple Shields Nipple shield size: 20;24 Breast pump type: Double-Electric Breast Pump WIC Program: Yes Pump Review: Setup, frequency, and cleaning;Milk Storage Initiated by:: Jasmine LatchShaorn Ernan Runkles, RN, IBCLC Date initiated:: 02/16/17   Consult Status Consult Status: Follow-up Date: 02/17/17 Follow-up type: In-patient    Jasmine Decker 02/16/2017, 2:48 PM

## 2017-02-16 NOTE — Discharge Instructions (Signed)

## 2017-02-20 ENCOUNTER — Ambulatory Visit: Payer: Self-pay

## 2017-02-20 NOTE — Lactation Note (Signed)
This note was copied from a baby's chart. Lactation Consult Weight today 7 # 9.1 oz 3432 g Jasmine Decker now 526 days old and mom reports she is nursing well with NS. Offered assist without NS. Baby took a few attempts then latched well. Encouragement given. Reviewed basics with mom. Reports she [facility]. Reports sometimes pumping hurts especially afterwards. She did not bring pump with her. Suggested trying #27 flanges and coconut oil to flange before pumping. To call back if that does not help. Offered another OP appointment but mom states she doesn't feel like she needs another one. No questions at present. Reviewed our phone number to call with questions/concerns.   Mother's reason for visit:  Help with breast feeding Visit Type:  Feeding assessment Appointment Notes:  Using NS at DC Consult:  Initial Lactation Consultant:  Pamelia HoitWeeks, Jermone Geister D  ________________________________________________________________________  Baby's Name:  Jasmine Decker Date of Birth:  02/14/2015 [facility] Gender:  female Gestational Age: 1822w0d (At Birth) Birth Weight:  7 lb 9.6 oz (3447 g)  ________________________________________________________________________  Mother's Name: Jasmine Decker Type of delivery:  Vaginal, Spontaneous Delivery Breastfeeding Experience:  P1 ________________________________________________________________________  Breastfeeding History (Post Discharge)  Frequency of breastfeeding:  q 2 hous Duration of feeding:  30 min  Supplementation  Formula:  Volume 0 ml          Breastmilk:  Volume 1-2 oz Frequency:  1-2 times/day If baby won't latch on Method:  Bottle,   Pumping  Type of pump:  Medela pump in style Frequency:  1-2 times/day Volume:  1-2 oz  Infant Intake and Output Assessment  Voids:  5+ in 24 hrs.  Color:  Clear yellow  Had void while here for appointment Stools:  5+ in 24 hrs.  Color:   Yellow  ________________________________________________________________________  Maternal Breast Assessment  Breast:  Filling Nipple:  Erect  _______________________________________________________________________ Feeding Assessment/Evaluation  Initial feeding assessment:    Positioning:  Football Right breast  LATCH documentation:  Latch:  1 = Repeated attempts needed to sustain latch, nipple held in mouth throughout feeding, stimulation needed to elicit sucking reflex.  Audible swallowing:  2 = Spontaneous and intermittent  Type of nipple:  2 = Everted at rest and after stimulation  Comfort (Breast/Nipple):  2 = Soft / non-tender  Hold (Positioning):  1 = Assistance needed to correctly position infant at breast and maintain latch  LATCH score:  8  Attached assessment:  Deep  Lips flanged:  Yes.    Lips untucked:  No.  Suck assessment:  Nutritive   Pre-feed weight:  3432 g 7 # 9.1 oz Post-feed weight:  3452 g 7 # 9.7 oz Amount transferred:  20 ml  Assisted mom to latch baby without NS. Jasmine Decker took a few attempts then latched well. Lots of swallows noted. Encouraged mom to hold breast throughout feeding and keep baby close to the breast. Encouragement given Mom reports no pain with latch, only tugging   Pre-feed weight:  3452 g   9 # 9.7 oz Post-feed weight:  3474 g  9 # 10.5 oz Amount transferred:  22  Ml  Assisted with latch wihout NS. Again she took a few attempts then latched well and nursed for 15 min, then going off to sleep  Total amount pumped post feed: did not pump since baby nursed on both breasts   Total amount transferred: 42 ml Total supplement given:  0 ml

## 2017-03-01 ENCOUNTER — Encounter (HOSPITAL_COMMUNITY): Payer: Self-pay

## 2017-03-01 ENCOUNTER — Inpatient Hospital Stay (HOSPITAL_COMMUNITY)
Admission: AD | Admit: 2017-03-01 | Discharge: 2017-03-01 | Disposition: A | Discharge status: Home / Self Care | Payer: Self-pay | Source: Ambulatory Visit | Attending: Obstetrics and Gynecology | Admitting: Obstetrics and Gynecology

## 2017-03-01 DIAGNOSIS — R509 Fever, unspecified: Secondary | ICD-10-CM | POA: Insufficient documentation

## 2017-03-01 DIAGNOSIS — O9123 Nonpurulent mastitis associated with lactation: Secondary | ICD-10-CM | POA: Insufficient documentation

## 2017-03-01 DIAGNOSIS — Z79899 Other long term (current) drug therapy: Secondary | ICD-10-CM | POA: Insufficient documentation

## 2017-03-01 DIAGNOSIS — R51 Headache: Secondary | ICD-10-CM | POA: Insufficient documentation

## 2017-03-01 DIAGNOSIS — Z9889 Other specified postprocedural states: Secondary | ICD-10-CM | POA: Insufficient documentation

## 2017-03-01 DIAGNOSIS — O9122 Nonpurulent mastitis associated with the puerperium: Secondary | ICD-10-CM

## 2017-03-01 DIAGNOSIS — Z9049 Acquired absence of other specified parts of digestive tract: Secondary | ICD-10-CM | POA: Insufficient documentation

## 2017-03-01 LAB — URINALYSIS, ROUTINE W REFLEX MICROSCOPIC
Bilirubin Urine: NEGATIVE
Glucose, UA: NEGATIVE mg/dL
Hgb urine dipstick: NEGATIVE
KETONES UR: NEGATIVE mg/dL
Nitrite: NEGATIVE
PROTEIN: NEGATIVE mg/dL
Specific Gravity, Urine: 1.02 (ref 1.005–1.030)
pH: 6 (ref 5.0–8.0)

## 2017-03-01 LAB — CBC WITH DIFFERENTIAL/PLATELET
BASOS ABS: 0 10*3/uL (ref 0.0–0.1)
Basophils Relative: 0 %
Eosinophils Absolute: 0.1 10*3/uL (ref 0.0–0.7)
Eosinophils Relative: 1 %
HEMATOCRIT: 34.5 % — AB (ref 36.0–46.0)
HEMOGLOBIN: 10.9 g/dL — AB (ref 12.0–15.0)
LYMPHS PCT: 15 %
Lymphs Abs: 1.9 10*3/uL (ref 0.7–4.0)
MCH: 25 pg — ABNORMAL LOW (ref 26.0–34.0)
MCHC: 31.6 g/dL (ref 30.0–36.0)
MCV: 79.1 fL (ref 78.0–100.0)
MONO ABS: 0.8 10*3/uL (ref 0.1–1.0)
Monocytes Relative: 7 %
NEUTROS ABS: 9.7 10*3/uL — AB (ref 1.7–7.7)
Neutrophils Relative %: 77 %
Platelets: 499 10*3/uL — ABNORMAL HIGH (ref 150–400)
RBC: 4.36 MIL/uL (ref 3.87–5.11)
RDW: 16.4 % — AB (ref 11.5–15.5)
WBC: 12.6 10*3/uL — AB (ref 4.0–10.5)

## 2017-03-01 MED ORDER — CEPHALEXIN 500 MG PO CAPS: 500.0000 mg | ORAL_CAPSULE | Freq: Four times a day (QID) | ORAL | 0 refills | Status: AC

## 2017-03-01 MED ORDER — ACETAMINOPHEN 500 MG PO TABS
1000.0000 mg | ORAL_TABLET | Freq: Once | ORAL | Status: AC
  Administered 2017-03-01: 1000 mg via ORAL
  Filled 2017-03-01: qty 2

## 2017-03-01 MED ORDER — CEPHALEXIN 500 MG PO CAPS
500.0000 mg | ORAL_CAPSULE | Freq: Once | ORAL | Status: AC
  Administered 2017-03-01: 500 mg via ORAL
  Filled 2017-03-01: qty 1

## 2017-03-01 NOTE — MAU Provider Note (Signed)
Chief Complaint: Postpartum Complications; Fever; Headache; and Chills   None     SUBJECTIVE HPI: Jasmine Decker is a 21 y.o. G1P1001 on PPD# 15 following NSVD who presents to maternity admissions reporting onset of fever/chills last night at 8 pm. The fever was as high as 101 but came down to 99 with ibuprofen 600 mg PO x 1 dose at home.  She denies any cough or nasal congestion, dysuria or other urinary symptoms, and reports her lochia are scant.  She denies any abdominal pain or cramping.  She reports breastfeeding is going well but she does have some redness in her right breast that started yesterday. She reports recently changing from breastfeeding at night to using a handpump at night and bottle feeding the baby the expressed milk to help the baby sleep.  The baby has gained weight appropriately per the pt and has a pediatrician check-up on Monday.  While in MAU, she has leaking of both breasts saturating her hospital gown. She denies vaginal itching/burning, urinary symptoms, h/a, dizziness, n/v, or fever/chills.     HPI  Past Medical History:  Diagnosis Date  . Gall stones    Past Surgical History:  Procedure Laterality Date  . CHOLECYSTECTOMY N/A 01/13/2015   Procedure: LAPAROSCOPIC CHOLECYSTECTOMY WITH POSSIBLE INTRAOPERATIVE CHOLANGIOGRAM;  Surgeon: Abigail Miyamoto, MD;  Location: [facility] OR;  Service: General;  Laterality: N/A;   Social History   Social History  . Marital status: Single    Spouse name: N/A  . Number of children: N/A  . Years of education: N/A   Occupational History  . Not on file.   Social History Main Topics  . Smoking status: Never Smoker  . Smokeless tobacco: Never Used     Comment: Brother does smoke around the home, patient denies smoking/drugs/alcohol  . Alcohol use No  . Drug use: No  . Sexual activity: Yes     Comment: LMP 10/05/13   Other Topics Concern  . Not on file   Social History Narrative   Lives at home with 81 year old  brother, mother, no pets.  Patient's only hospital visit includes a trip to the ED for decreased po intake and swelling in her throat, no admission.  This was at 21 years of age.   No current facility-administered medications on file prior to encounter.    Current Outpatient Prescriptions on File Prior to Encounter  Medication Sig Dispense Refill  . ibuprofen (ADVIL,MOTRIN) 600 MG tablet Take 1 tablet (600 mg total) by mouth every 6 (six) hours. 30 tablet 0  . Prenatal Vit-Fe Fumarate-FA (PRENATAL MULTIVITAMIN) TABS tablet Take 1 tablet by mouth daily.      No Known Allergies  ROS:  Review of Systems  Constitutional: Positive for chills and fever. Negative for fatigue.  HENT: Negative for rhinorrhea and sore throat.   Respiratory: Negative for cough and shortness of breath.   Cardiovascular: Negative for chest pain.  Gastrointestinal: Negative for nausea and vomiting.  Genitourinary: Negative for difficulty urinating, dysuria, flank pain, pelvic pain, vaginal bleeding, vaginal discharge and vaginal pain.  Skin: Positive for color change.       Redness of right breast  Neurological: Negative for dizziness and headaches.  Psychiatric/Behavioral: Negative.      I have reviewed patient's Past Medical Hx, Surgical Hx, Family Hx, Social Hx, medications and allergies.   Physical Exam   Patient Vitals for the past 24 hrs:  BP Temp Temp src Pulse Resp SpO2  03/01/17 0409 - - - - -  99 %  03/01/17 0408 107/66 99.9 F (37.7 C) Oral (!) 107 20 -   Constitutional: Well-developed, well-nourished female in no acute distress.  Cardiovascular: normal rate Respiratory: normal effort Breast: Erythema of right breast with streaks of redness underneath and to right of areola, right breast firm and hot to touch GI: Abd soft, non-tender. Pos BS x 4 MS: Extremities nontender, no edema, normal ROM Neurologic: Alert and oriented x 4.  GU: Neg CVAT.   LAB RESULTS Results for orders placed or  performed during the hospital encounter of 03/01/17 (from the past 24 hour(s))  Urinalysis, Routine w reflex microscopic     Status: Abnormal   Collection Time: 03/01/17  4:00 AM  Result Value Ref Range   Color, Urine YELLOW YELLOW   APPearance CLEAR CLEAR   Specific Gravity, Urine 1.020 1.005 - 1.030   pH 6.0 5.0 - 8.0   Glucose, UA NEGATIVE NEGATIVE mg/dL   Hgb urine dipstick NEGATIVE NEGATIVE   Bilirubin Urine NEGATIVE NEGATIVE   Ketones, ur NEGATIVE NEGATIVE mg/dL   Protein, ur NEGATIVE NEGATIVE mg/dL   Nitrite NEGATIVE NEGATIVE   Leukocytes, UA SMALL (A) NEGATIVE   RBC / HPF 0-5 0 - 5 RBC/hpf   WBC, UA 6-30 0 - 5 WBC/hpf   Bacteria, UA RARE (A) NONE SEEN   Squamous Epithelial / LPF 0-5 (A) NONE SEEN   Mucous PRESENT   CBC with Differential/Platelet     Status: Abnormal   Collection Time: 03/01/17  5:05 AM  Result Value Ref Range   WBC 12.6 (H) 4.0 - 10.5 K/uL   RBC 4.36 3.87 - 5.11 MIL/uL   Hemoglobin 10.9 (L) 12.0 - 15.0 g/dL   HCT 16.1 (L) 09.6 - 04.5 %   MCV 79.1 78.0 - 100.0 fL   MCH 25.0 (L) 26.0 - 34.0 pg   MCHC 31.6 30.0 - 36.0 g/dL   RDW 40.9 (H) 81.1 - 91.4 %   Platelets 499 (H) 150 - 400 K/uL   Neutrophils Relative % 77 %   Neutro Abs 9.7 (H) 1.7 - 7.7 K/uL   Lymphocytes Relative 15 %   Lymphs Abs 1.9 0.7 - 4.0 K/uL   Monocytes Relative 7 %   Monocytes Absolute 0.8 0.1 - 1.0 K/uL   Eosinophils Relative 1 %   Eosinophils Absolute 0.1 0.0 - 0.7 K/uL   Basophils Relative 0 %   Basophils Absolute 0.0 0.0 - 0.1 K/uL    --/--/A POS, A POS (03/09 1520)  IMAGING No results found.  MAU Management/MDM: Ordered labs and reviewed results.  Slightly elevated WBCs with no evidence of infection of the abdomen, no signs of endometritis or UTI.  No evidence of URI.  Urine sent for culture.  Will treat for lactational mastitis with Keflex 500 mg QID x 10 days, first dose given in MAU.  Tylenol 1000 mg PO x 1 dose in MAU. Pt reports improved chills/headache after  medication.  S/Sx Samella Parr to return to MAU reviewed. Offered for lactation consultant to see pt in MAU but pt reports infant is feeding well so will see lactation outpatient as needed.  Pt stable at time of discharge.  ASSESSMENT 1. Mastitis, obstetric, delivered with postpartum condition     PLAN Discharge home  Allergies as of 03/01/2017   No Known Allergies     Medication List    TAKE these medications   cephALEXin 500 MG capsule Commonly known as:  KEFLEX Take 1 capsule (500 mg total) by mouth  4 (four) times daily.   ibuprofen 600 MG tablet Commonly known as:  ADVIL,MOTRIN Take 1 tablet (600 mg total) by mouth every 6 (six) hours.   prenatal multivitamin Tabs tablet Take 1 tablet by mouth daily.      Follow-up Information    [facility]   Why:  For postpartum visit as scheduled. Return to MAU as needed for emergencies. Contact information: [address] [place] [address] [phone]        [facility] Follow [facility].   Specialty:  Lactation Why:  As needed for breastfeeding support Contact information: [address] [id number] [place] [id number] [phone]          Sharen CounterLisa Leftwich-Kirby Certified Nurse-Midwife 03/01/2017  5:43 AM

## 2017-03-02 LAB — CULTURE, OB URINE

## 2021-01-06 ENCOUNTER — Other Ambulatory Visit: Payer: Self-pay

## 2021-01-21 ENCOUNTER — Ambulatory Visit: Payer: Self-pay | Admitting: *Deleted

## 2021-01-21 ENCOUNTER — Telehealth (INDEPENDENT_AMBULATORY_CARE_PROVIDER_SITE_OTHER): Payer: Self-pay

## 2021-01-21 NOTE — Telephone Encounter (Signed)
Assisted by Bernita Buffy, Interpreter # [phone]; last due beginning of December 2021; her LMP was 10/10/20; her implant was removed by the [facility] 03/08/20; the pt says she has ranged from 51-56 days between periods; recommendations made per nurse triage protocol; she verbalized understanding; the pt says she does not have a PCP or GYN; the pt's information was sent to [facility] per [facility] Agent, Jaquita Rector; th ept can be contacted at [phone].  Reason for Disposition . Missed 2 or more periods in a row  Answer Assessment - Initial Assessment Questions 1. LMP:  "When did your last menstrual period begin?"     10/10/20 2. DAYS LATE: "How many days late is your menstrual period?"     3 months 3. REGULARITY: "How regular are your periods?"   " Normally 1st week of every month" 4. PREGNANCY: "Is there any chance you are pregnant?" (e.g., unprotected intercourse, missed birth control pill, broken condom) "Have you used a home pregnancy test?"   .yes; pregnancy tests x 3 negative 5. BREASTFEEDING: "Are you breastfeeding?"     no 6. BIRTH CONTROL PILLS: "Are you taking birth control pills, or have you stopped recently?"     Negative,stopped April 2021 7. DEPOPROVERA: "Has your doctor given you a shot to prevent pregnancy?" (e.g., Depoprovera injection)     no 8. CAUSE: "What do you think caused the missed period?" (e.g., stress, rapid weight loss, excessive exercise)     Not sure 9. OTHER SYMPTOMS: "Do you have any other symptoms?" (e.g., abdominal pain)    Cramps weekly  Protocols used: MENSTRUAL PERIOD - MISSED OR LATE-A-AH

## 2021-01-21 NOTE — Telephone Encounter (Signed)
Copied from CRM [id number]. Topic: General - Other >> Jan 21, 2021  8:27 AM Jaquita Rector A wrote: Reason for CRM: Patient called in stated that she have not had her menstrual cycle in about 3 months taken pregnancy test that all have been negative and need to discuss what the issue may be the issue. She have no health insurance but would like to be seen by a provider can somecall her for either the office or the mobile unit for sometimes this week. Please call Ph#  [phone]  Called patient and LVM advising her that we would not be able to schedule her at [facility] this week but she could be scheduled with the mobile unit. Advised her to call [phone] to directly schedule with mobile or call us back with any questions or concerns at [phone].

## 2021-02-28 ENCOUNTER — Other Ambulatory Visit: Payer: Self-pay

## 2021-02-28 ENCOUNTER — Ambulatory Visit: Payer: Self-pay | Admitting: Physician Assistant

## 2021-02-28 VITALS — BP 129/81 | HR 100 | Temp 98.2°F | Resp 18 | Ht 63.0 in | Wt 256.0 lb

## 2021-02-28 DIAGNOSIS — Z6841 Body Mass Index (BMI) 40.0 and over, adult: Secondary | ICD-10-CM

## 2021-02-28 DIAGNOSIS — N912 Amenorrhea, unspecified: Secondary | ICD-10-CM

## 2021-02-28 DIAGNOSIS — Z3202 Encounter for pregnancy test, result negative: Secondary | ICD-10-CM

## 2021-02-28 DIAGNOSIS — O99210 Obesity complicating pregnancy, unspecified trimester: Secondary | ICD-10-CM | POA: Insufficient documentation

## 2021-02-28 NOTE — Patient Instructions (Signed)
We will call you with your lab results.  Kennieth Rad, PA-C Physician Assistant [place] [email or web address]    Secondary Amenorrhea  Secondary amenorrhea occurs when a female who was previously having menstrual periods has not had them for 3-6 months. A menstrual period is the monthly shedding of the lining of the uterus. The lining of the uterus is made up of blood, tissue, fluid, and mucus. The flow of blood usually occurs during 3-7 consecutive days each month. This condition has many causes. In many cases, treating the underlying cause will return menstrual periods back to a normal cycle. What are the causes? The most common cause of this condition is pregnancy. Other medical conditions that can cause secondary amenorrhea include:  Cirrhosis of the liver.  Conditions of the blood.  Diabetes.  Epilepsy.  Chronic kidney disease.  Polycystic ovary disease.  A hormonal imbalance.  Ovarian failure.  Cystic fibrosis.  Early menopause.  Cushing syndrome.  Thyroid problems. Other causes may include:  Malnutrition.  Stress or anxiety.  Medicines.  Extreme obesity.  Low body weight or drastic weight loss.  Removal of the ovaries or uterus.  Contraceptive pills, patches, or vaginal rings. What increases the risk? You are more likely to develop this condition if:  You have a family history of this condition.  You have an eating disorder.  You do extreme athletic training.  You have a chronic disease.  You abuse substances such as alcohol or cigarettes. What are the signs or symptoms? The main symptom of this condition is a lack of menstrual periods for 3-6 months in a female who previously had menstrual periods. How is this diagnosed? This condition may be diagnosed based on:  Your medical history.  A physical exam.  A pelvic exam to check for problems with your reproductive organs.  A  procedure to examine the uterus.  A measurement of your body mass index (BMI). You may also have other tests, including:  Blood tests that measure certain hormones in your body and rule out pregnancy.  Urine tests.  Imaging tests, such as an ultrasound, CT scan, or MRI. How is this treated? Treatment for this condition depends on the cause of the amenorrhea. It may involve:  Correcting diet-related problems.  Treating underlying conditions.  Medicines.  Lifestyle changes.  Surgery. If the condition cannot be corrected, it is sometimes possible to start menstrual periods with medicines. Follow these instructions at home: Lifestyle  Maintain a healthy diet. In general, a healthy diet includes lots of fruits and vegetables, low-fat dairy products, lean meats, and foods that contain fiber. Ask to meet with a registered dietitian for nutrition counseling and meal planning.  Maintain a healthy weight. Talk to your health care provider before trying any new diet or exercise plan.  Exercise at least 30 minutes 5 or more days each week. Exercising includes brisk walking, yard work, biking, running, swimming, and team sports like basketball and soccer. Ask your health care provider which exercises are safe for you.  Get enough sleep. Plan your sleep time to allow for 7-9 hours of sleep each night.  Learn to manage stress. Explore relaxation techniques such as meditation, journaling, yoga, or tai chi.      General instructions  Be aware of changes in your menstrual cycle. Keep a record of when you have your menstrual period. Note the date your period starts, how long it lasts, and any problems you experience.  Take over-the-counter and prescription medicines only as  told by your health care provider.  Keep all follow-up visits. This is important. Contact a health care provider if:  Your periods do not return to normal after treatment. Summary  Secondary amenorrhea is when a  female who was previously having menstrual periods has not gotten her period for 3-6 months.  This condition has many causes. In many cases, treating the underlying cause will return menstrual periods back to a normal cycle.  Talk to your health care provider if your periods do not return to normal after treatment. This information is not intended to replace advice given to you by your health care provider. Make sure you discuss any questions you have with your health care provider. Document Revised: 07/11/2020 Document Reviewed: 07/11/2020 Elsevier Patient Education  2021 [employer].

## 2021-02-28 NOTE — Progress Notes (Signed)
New Patient Office Visit  Subjective:  Patient ID: Jasmine Decker, female    DOB: Sep 26, 1996  Age: 25 y.o. MRN: [id number]  CC:  Chief Complaint  Patient presents with  . Amenorrhea    Since 08/2020    HPI Jasmine Decker reports that she has not had a menses in the last 4 months.  Reports that she had the Nexplanon for 4 years, states it was removed in April 2021.  Reports that she had a couple of menstrual cycles after the Nexplanon was removed, states they were not consistent, and states that the last one was 4 months ago.  Reports that she has taken several at home pregnancy tests that were all negative.  Denies abdominal pain.      Past Medical History:  Diagnosis Date  . Gall stones     Past Surgical History:  Procedure Laterality Date  . CHOLECYSTECTOMY N/A 01/13/2015   Procedure: LAPAROSCOPIC CHOLECYSTECTOMY WITH POSSIBLE INTRAOPERATIVE CHOLANGIOGRAM;  Surgeon: Abigail Miyamoto, MD;  Location: [facility];  Service: General;  Laterality: N/A;    Family History  Problem Relation Age of Onset  . Gallbladder disease Maternal Aunt        3 aunts have had gallbladder removal    Social History   Socioeconomic History  . Marital status: Single    Spouse name: Not on file  . Number of children: Not on file  . Years of education: Not on file  . Highest education level: Not on file  Occupational History  . Not on file  Tobacco Use  . Smoking status: Never Smoker  . Smokeless tobacco: Never Used  . Tobacco comment: Brother does smoke around the home, patient denies smoking/drugs/alcohol  Substance and Sexual Activity  . Alcohol use: No  . Drug use: No  . Sexual activity: Yes    Comment: LMP 10/05/13  Other Topics Concern  . Not on file  Social History Narrative   Lives at home with 88 year old brother, mother, no pets.  Patient's only hospital visit includes a trip to the ED for decreased po intake and swelling in her throat, no admission.  This was at 25  years of age.   Social Determinants of Health   Financial Resource Strain: Not on file  Food Insecurity: Not on file  Transportation Needs: Not on file  Physical Activity: Not on file  Stress: Not on file  Social Connections: Not on file  Intimate Partner Violence: Not on file    ROS Review of Systems  Constitutional: Negative for chills, fatigue and fever.  HENT: Negative.   Eyes: Negative.   Respiratory: Negative for shortness of breath.   Cardiovascular: Negative for chest pain.  Gastrointestinal: Negative for abdominal pain, diarrhea, nausea and vomiting.  Endocrine: Negative for cold intolerance and polyuria.  Genitourinary: Positive for menstrual problem. Negative for dysuria, frequency, hematuria and vaginal discharge.  Musculoskeletal: Negative.   Skin: Negative.   Allergic/Immunologic: Negative.   Neurological: Negative for headaches.  Hematological: Negative.   Psychiatric/Behavioral: Negative.     Objective:   Today's Vitals: BP 129/81 (BP Location: Left Arm, Patient Position: Sitting, Cuff Size: Large)   Pulse 100   Temp 98.2 F (36.8 C) (Oral)   Resp 18   Ht 5\' 3"  (1.6 m)   Wt 256 lb (116.1 kg)   LMP 08/22/2020   SpO2 96%   BMI 45.35 kg/m   Physical Exam Vitals and nursing note reviewed.  Constitutional:      Appearance:  Normal appearance.  HENT:     Head: Normocephalic and atraumatic.     Right Ear: External ear normal.     Left Ear: External ear normal.     Nose: Nose normal.     Mouth/Throat:     Mouth: Mucous membranes are moist.     Pharynx: Oropharynx is clear.  Eyes:     Extraocular Movements: Extraocular movements intact.     Conjunctiva/sclera: Conjunctivae normal.     Pupils: Pupils are equal, round, and reactive to light.  Cardiovascular:     Rate and Rhythm: Normal rate and regular rhythm.     Pulses: Normal pulses.     Heart sounds: Normal heart sounds.  Pulmonary:     Effort: Pulmonary effort is normal.     Breath sounds:  Normal breath sounds.  Abdominal:     General: Abdomen is flat.     Palpations: Abdomen is soft.     Tenderness: There is no abdominal tenderness.  Musculoskeletal:        General: Normal range of motion.     Cervical back: Normal range of motion and neck supple.  Skin:    General: Skin is warm and dry.  Neurological:     General: No focal deficit present.     Mental Status: She is alert and oriented to person, place, and time.  Psychiatric:        Mood and Affect: Mood normal.        Behavior: Behavior normal.        Thought Content: Thought content normal.        Judgment: Judgment normal.     Assessment & Plan:   Problem List Items Addressed This Visit      Other   Amenorrhea - Primary   Relevant Orders   CBC with Differential/Platelet   Comp. Metabolic Panel (12)   TSH   hCG, serum, qualitative   Morbid obesity with BMI of 45.0-49.9, adult [facility])    Other Visit Diagnoses    Encounter for pregnancy test with result negative          Outpatient Encounter Medications as of 02/28/2021  Medication Sig  . [DISCONTINUED] ibuprofen (ADVIL,MOTRIN) 600 MG tablet Take 1 tablet (600 mg total) by mouth every 6 (six) hours.  . [DISCONTINUED] Prenatal Vit-Fe Fumarate-FA (PRENATAL MULTIVITAMIN) TABS tablet Take 1 tablet by mouth daily.    No facility-administered encounter medications on file as of 02/28/2021.  1. Amenorrhea Patient education given.  Patient given information on [place] financial assistance, patient given appointment to establish care at [facility] with Delfin Gant on Apr 22, 2021. - CBC with Differential/Platelet - Comp. Metabolic Panel (12) - TSH - hCG, serum, qualitative  2. Morbid obesity with BMI of 45.0-49.9, adult (HCC)   3. Encounter for pregnancy test with result negative   I have reviewed the patient's medical history (PMH, PSH, Social History, Family History, Medications, and allergies) , and have been updated if relevant. I  spent 30 minutes reviewing chart and  face to face time with patient.     Follow-up: Return in about 8 weeks (around 04/22/2021) for with Ricky Stabs, NP at [facility].   Kasandra Knudsen Mayers, PA-C

## 2021-02-28 NOTE — Progress Notes (Signed)
Patient has not had a menstrual in 4 months. Patient has eaten today and patient has not taken medication today. Patient has a 23 year old. Patient had nexplanon removed a year ago. Patients cycle was EOM and then in September her cycles ceased. Patient denies pain at this time.

## 2021-03-01 LAB — COMP. METABOLIC PANEL (12)
AST: 134 IU/L — ABNORMAL HIGH (ref 0–40)
Albumin/Globulin Ratio: 1 — ABNORMAL LOW (ref 1.2–2.2)
Albumin: 4.5 g/dL (ref 3.9–5.0)
Alkaline Phosphatase: 92 IU/L (ref 44–121)
BUN/Creatinine Ratio: 9 (ref 9–23)
BUN: 6 mg/dL (ref 6–20)
Bilirubin Total: 0.8 mg/dL (ref 0.0–1.2)
Calcium: 9.8 mg/dL (ref 8.7–10.2)
Chloride: 100 mmol/L (ref 96–106)
Creatinine, Ser: 0.65 mg/dL (ref 0.57–1.00)
Globulin, Total: 4.3 g/dL (ref 1.5–4.5)
Glucose: 98 mg/dL (ref 65–99)
Potassium: 4.3 mmol/L (ref 3.5–5.2)
Sodium: 138 mmol/L (ref 134–144)
Total Protein: 8.8 g/dL — ABNORMAL HIGH (ref 6.0–8.5)
eGFR: 128 mL/min/{1.73_m2} (ref >59)

## 2021-03-01 LAB — CBC WITH DIFFERENTIAL/PLATELET
Basophils Absolute: 0 10*3/uL (ref 0.0–0.2)
Basos: 0 %
EOS (ABSOLUTE): 0.2 10*3/uL (ref 0.0–0.4)
Eos: 1 %
Hematocrit: 41.3 % (ref 34.0–46.6)
Hemoglobin: 13.4 g/dL (ref 11.1–15.9)
Immature Grans (Abs): 0.1 10*3/uL (ref 0.0–0.1)
Immature Granulocytes: 1 %
Lymphocytes Absolute: 4.5 10*3/uL — ABNORMAL HIGH (ref 0.7–3.1)
Lymphs: 36 %
MCH: 28.4 pg (ref 26.6–33.0)
MCHC: 32.4 g/dL (ref 31.5–35.7)
MCV: 88 fL (ref 79–97)
Monocytes Absolute: 0.6 10*3/uL (ref 0.1–0.9)
Monocytes: 5 %
Neutrophils Absolute: 7 10*3/uL (ref 1.4–7.0)
Neutrophils: 57 %
Platelets: 451 10*3/uL — ABNORMAL HIGH (ref 150–450)
RBC: 4.72 x10E6/uL (ref 3.77–5.28)
RDW: 12.6 % (ref 11.7–15.4)
WBC: 12.4 10*3/uL — ABNORMAL HIGH (ref 3.4–10.8)

## 2021-03-01 LAB — TSH: TSH: 3.04 u[IU]/mL (ref 0.450–4.500)

## 2021-03-01 LAB — HCG, SERUM, QUALITATIVE: hCG,Beta Subunit,Qual,Serum: NEGATIVE m[IU]/mL (ref <6)

## 2021-03-05 ENCOUNTER — Telehealth: Payer: Self-pay | Admitting: *Deleted

## 2021-03-05 ENCOUNTER — Other Ambulatory Visit: Payer: Self-pay | Admitting: Physician Assistant

## 2021-03-05 DIAGNOSIS — N912 Amenorrhea, unspecified: Secondary | ICD-10-CM

## 2021-03-05 NOTE — Telephone Encounter (Signed)
Medical Assistant used [employer] to contact patient.  Interpreter Name: Wilmer Interpreter #: [phone] Patient verified DOB Patient is aware of WBC and Lymphocytes being elevated due to seasonal allergies. Patient is also aware of kidney function being normal with elevated liver enzyme being chronic and [place] in 2016 showing normal image of liver. Patient plans to complete financial packet for [facility] and would like referral to [facility].

## 2021-03-05 NOTE — Telephone Encounter (Signed)
-----   Message from Roney Jaffe, [place] sent at 03/05/2021  9:55 AM EDT ----- Please call patient and let her know that her white blood cell  and lymphocytes level were slightly elevated, this could be due to seasonal allergies.   Kidney function was WNL, she does have an elevated liver enzyme but this appears to be chronic. She has had an [place] of her liver completed in 2016 when this was first seen and it was WNL.  Her thyroid is WNL and her pregnancy test was negative.    Please let me know if she would like a referral to [facility]

## 2021-03-13 ENCOUNTER — Other Ambulatory Visit: Payer: Self-pay

## 2021-03-13 ENCOUNTER — Ambulatory Visit: Payer: Self-pay | Attending: Family Medicine

## 2021-04-12 ENCOUNTER — Ambulatory Visit (INDEPENDENT_AMBULATORY_CARE_PROVIDER_SITE_OTHER): Payer: Self-pay | Admitting: Obstetrics and Gynecology

## 2021-04-12 ENCOUNTER — Encounter: Payer: Self-pay | Admitting: Obstetrics and Gynecology

## 2021-04-12 ENCOUNTER — Other Ambulatory Visit: Payer: Self-pay

## 2021-04-12 ENCOUNTER — Other Ambulatory Visit (HOSPITAL_COMMUNITY)
Admission: RE | Admit: 2021-04-12 | Discharge: 2021-04-12 | Disposition: A | Discharge status: Home / Self Care | Payer: Self-pay | Source: Ambulatory Visit | Attending: Obstetrics and Gynecology | Admitting: Obstetrics and Gynecology

## 2021-04-12 VITALS — BP 105/74 | HR 76 | Ht 63.0 in | Wt 257.0 lb

## 2021-04-12 DIAGNOSIS — Z6841 Body Mass Index (BMI) 40.0 and over, adult: Secondary | ICD-10-CM

## 2021-04-12 DIAGNOSIS — N912 Amenorrhea, unspecified: Secondary | ICD-10-CM

## 2021-04-12 LAB — POCT URINE PREGNANCY: Preg Test, Ur: NEGATIVE

## 2021-04-12 NOTE — Progress Notes (Signed)
    GYNECOLOGY  ENCOUNTER NOTE  History:     Jasmine Decker is a 25 y.o. G46P1001 female here for concerns about abnormal menstrual cycles. Nexplanon was in for 3 years, and in April of last year she had it out. She does desires pregnancy. She says since her nexplanon was removed her periods have been abnormal.  She will have a period then skip several months without a period. Nov 3 was her last period. Her period this month was normal.  Reports no changes in her weight.   She has no insurance.   Gynecologic History Patient's last menstrual period was 04/08/2021. Contraception: none Last Pap: 2021- abnormal done at the [facility].  Last mammogram: NA  Obstetric History OB History  Gravida Para Term Preterm AB Living  1 1 1     1   SAB IAB Ectopic Multiple Live Births        0 1    # Outcome Date GA Lbr Len/2nd Weight Sex Delivery Anes PTL Lv  1 Term 02/14/17 [redacted]w[redacted]d 60:39 / 02:16 7 lb 9.6 oz (3.447 kg) F Vag-Spont EPI  LIV     Birth Comments: WNL    Past Medical History:  Diagnosis Date  . Gall stones     Past Surgical History:  Procedure Laterality Date  . CHOLECYSTECTOMY N/A 01/13/2015   Procedure: LAPAROSCOPIC CHOLECYSTECTOMY WITH POSSIBLE INTRAOPERATIVE CHOLANGIOGRAM;  Surgeon: 03/14/2015, MD;  Location: [facility] OR;  Service: General;  Laterality: N/A;    No current outpatient medications on file prior to visit.   No current facility-administered medications on file prior to visit.    No Known Allergies  Social History:  reports that she has never smoked. She has never used smokeless tobacco. She reports that she does not drink alcohol and does not use drugs.  Family History  Problem Relation Age of Onset  . Gallbladder disease Maternal Aunt        3 aunts have had gallbladder removal    The following portions of the patient's history were reviewed and updated as appropriate: allergies, current medications, past family history, past medical history, past social  history, past surgical history and problem list.  Review of Systems Pertinent items noted in HPI and remainder of comprehensive ROS otherwise negative.  Physical Exam:  BP 105/74   Pulse 76   Ht 5\' 3"  (1.6 m)   Wt 257 lb (116.6 kg)   LMP 04/08/2021   Breastfeeding No   BMI 45.53 kg/m  CONSTITUTIONAL: Well-developed, well-nourished female in no acute distress.  HENT:  Normocephalic, atraumatic EYES: Conjunctivae and EOM are normal. SKIN: Skin is warm and dry.   Assessment and Plan:    1. Amenorrhea  - POCT urine pregnancy - HgB A1c - GC/Chlamydia probe amp ([place])not at [facility] - Schedule annual with Pap. - limited labs d/t no insurance.   2. Morbid obesity with BMI of 45.0-49.9, adult [facility])    [facility] [birth year][facility], [facility]

## 2021-04-12 NOTE — Progress Notes (Signed)
Pt had period November 2021 and then not again until May 2022

## 2021-04-15 LAB — COMPREHENSIVE METABOLIC PANEL
AG Ratio: 1.1 (calc) (ref 1.0–2.5)
ALT: 131 U/L — ABNORMAL HIGH (ref 6–29)
AST: 90 U/L — ABNORMAL HIGH (ref 10–30)
Albumin: 4.4 g/dL (ref 3.6–5.1)
Alkaline phosphatase (APISO): 68 U/L (ref 31–125)
BUN: 8 mg/dL (ref 7–25)
CO2: 24 mmol/L (ref 20–32)
Calcium: 9.6 mg/dL (ref 8.6–10.2)
Chloride: 104 mmol/L (ref 98–110)
Creat: 0.52 mg/dL (ref 0.50–1.10)
Globulin: 4.1 g/dL (calc) — ABNORMAL HIGH (ref 1.9–3.7)
Glucose, Bld: 93 mg/dL (ref 65–99)
Potassium: 4.3 mmol/L (ref 3.5–5.3)
Sodium: 136 mmol/L (ref 135–146)
Total Bilirubin: 1.3 mg/dL — ABNORMAL HIGH (ref 0.2–1.2)
Total Protein: 8.5 g/dL — ABNORMAL HIGH (ref 6.1–8.1)

## 2021-04-15 LAB — HEPATITIS PANEL, ACUTE
Hep A IgM: NONREACTIVE
Hep B C IgM: NONREACTIVE
Hepatitis B Surface Ag: NONREACTIVE
Hepatitis C Ab: NONREACTIVE
SIGNAL TO CUT-OFF: 0.04 (ref <1.00)

## 2021-04-15 LAB — HEMOGLOBIN A1C
Hgb A1c MFr Bld: 5.4 % of total Hgb (ref <5.7)
Mean Plasma Glucose: 108 mg/dL
eAG (mmol/L): 6 mmol/L

## 2021-04-15 LAB — HIV ANTIBODY (ROUTINE TESTING W REFLEX): HIV 1&2 Ab, 4th Generation: NONREACTIVE

## 2021-04-16 LAB — GC/CHLAMYDIA PROBE AMP (~~LOC~~) NOT AT ARMC
Chlamydia: NEGATIVE
Comment: NEGATIVE
Comment: NORMAL
Neisseria Gonorrhea: NEGATIVE

## 2021-04-22 ENCOUNTER — Ambulatory Visit: Payer: Self-pay | Admitting: Family

## 2021-04-22 NOTE — Progress Notes (Signed)
Virtual Visit via Telephone Note  I connected with Jasmine Decker, on 04/23/2021 at 10:27 AM by telephone due to the COVID-19 pandemic and verified that I am speaking with the correct person using two identifiers.  Due to current restrictions/limitations of in-office visits due to the COVID-19 pandemic, this scheduled clinical appointment was converted to a telehealth visit.   Consent: I discussed the limitations, risks, security and privacy concerns of performing an evaluation and management service by telephone and the availability of in person appointments. I also discussed with the patient that there may be a patient responsible charge related to this service. The patient expressed understanding and agreed to proceed.   Location of Patient: Home  Location of Provider: [facility]   Persons participating in Telemedicine visit: Hayly Decker Ricky Stabs, NP Margorie John, CMA  History of Present Illness: Jasmine Decker is a 25 year-old female who presents to establish care. PMH significant for gallstones.   Current issues and/or concerns: Reports concern for elevated liver tests most recently at her OB/GYN visit on 04/12/2021.   Also, concern for right great toenail fungus. Reports began while pregnant in 2018 and at that time was not treated. Since then has gotten worse.   Past Medical History:  Diagnosis Date  . Gall stones    No Known Allergies  No current outpatient medications on file prior to visit.   No current facility-administered medications on file prior to visit.    Observations/Objective: Alert and oriented x 3. Not in acute distress. Physical examination not completed as this is a telemedicine visit.  Assessment and Plan: 1. Encounter to establish care: - Patient presents today to establish care.  - Return for annual physical examination, labs, and health maintenance. Arrive fasting meaning having no food  for at least 8 hours prior to appointment. You may have only water or black coffee. Please take scheduled medications as normal.  2. Elevated liver enzymes: - Plan to recheck at in-person annual physical exam.  3. Toenail fungus: - Chronic right great toenail. - Per patient preference referral to Podiatry for further evaluation and management.  Follow Up Instructions: Return for annual physical exam.   Patient was given clear instructions to go to Emergency Department or return to medical center if symptoms don't improve, worsen, or new problems develop.The patient verbalized understanding.  I discussed the assessment and treatment plan with the patient. The patient was provided an opportunity to ask questions and all were answered. The patient agreed with the plan and demonstrated an understanding of the instructions.   The patient was advised to call back or seek an in-person evaluation if the symptoms worsen or if the condition fails to improve as anticipated.   I provided 10 mminutes total of non-face-to-face time during this encounter.   Rema Fendt, NP  [facility] [place], [place] [phone] 04/23/2021, 10:27 AM

## 2021-04-23 ENCOUNTER — Telehealth (INDEPENDENT_AMBULATORY_CARE_PROVIDER_SITE_OTHER): Payer: Self-pay | Admitting: Family

## 2021-04-23 ENCOUNTER — Encounter: Payer: Self-pay | Admitting: Family

## 2021-04-23 ENCOUNTER — Other Ambulatory Visit: Payer: Self-pay

## 2021-04-23 DIAGNOSIS — Z7689 Persons encountering health services in other specified circumstances: Secondary | ICD-10-CM

## 2021-04-23 DIAGNOSIS — R748 Abnormal levels of other serum enzymes: Secondary | ICD-10-CM

## 2021-04-23 DIAGNOSIS — B351 Tinea unguium: Secondary | ICD-10-CM

## 2021-04-23 NOTE — Progress Notes (Signed)
Establish care

## 2021-04-30 ENCOUNTER — Other Ambulatory Visit: Payer: Self-pay

## 2021-04-30 ENCOUNTER — Ambulatory Visit (INDEPENDENT_AMBULATORY_CARE_PROVIDER_SITE_OTHER): Payer: Self-pay | Admitting: Obstetrics and Gynecology

## 2021-04-30 ENCOUNTER — Encounter: Payer: Self-pay | Admitting: Obstetrics and Gynecology

## 2021-04-30 NOTE — Progress Notes (Signed)
Patient here for annual exam. She is self pay. Offered annual with [facility] and patient would like to go there Message/chart sent to [facility]. Pap deferred until that visit.  Duane Lope, NP 04/30/2021 9:56 AM

## 2021-05-16 NOTE — Progress Notes (Signed)
Patient ID: Jasmine Decker, female    DOB: June 04, 1996  MRN: [id number]  CC: Annual Physical Exam  Subjective: Jasmine Decker is a 25 y.o. female who presents for annual physical exam.   Her concerns today include: has occasional right hip pain, no history of trauma or injury, taking over-the-counter supplements to help.   Patient Active Problem List   Diagnosis Date Noted   Amenorrhea 02/28/2021   Morbid obesity with BMI of 45.0-49.9, adult (HCC) 02/28/2021     No current outpatient medications on file prior to visit.   No current facility-administered medications on file prior to visit.    No Known Allergies  Social History   Socioeconomic History   Marital status: Single    Spouse name: Not on file   Number of children: Not on file   Years of education: Not on file   Highest education level: Not on file  Occupational History   Not on file  Tobacco Use   Smoking status: Passive Smoke Exposure - Never Smoker   Smokeless tobacco: Never   Tobacco comments:    Brother does smoke around the home, patient denies smoking/drugs/alcohol  Vaping Use   Vaping Use: Never used  Substance and Sexual Activity   Alcohol use: Never   Drug use: Never   Sexual activity: Yes    Birth control/protection: None  Other Topics Concern   Not on file  Social History Narrative   Lives at home with 71 year old brother, mother, no pets.  Patient's only hospital visit includes a trip to the ED for decreased po intake and swelling in her throat, no admission.  This was at 25 years of age.   Social Determinants of Health   Financial Resource Strain: Not on file  Food Insecurity: Not on file  Transportation Needs: Not on file  Physical Activity: Not on file  Stress: Not on file  Social Connections: Not on file  Intimate Partner Violence: Not on file    Family History  Problem Relation Age of Onset   Gallbladder disease Maternal Aunt        3 aunts have had gallbladder  removal    Past Surgical History:  Procedure Laterality Date   CHOLECYSTECTOMY N/A 01/13/2015   Procedure: LAPAROSCOPIC CHOLECYSTECTOMY WITH POSSIBLE INTRAOPERATIVE CHOLANGIOGRAM;  Surgeon: Abigail Miyamoto, MD;  Location: [facility] OR;  Service: General;  Laterality: N/A;    ROS: Review of Systems Negative except as stated above  PHYSICAL EXAM: BP 105/73 (BP Location: Left Arm, Patient Position: Sitting, Cuff Size: Large)   Pulse 86   Temp 97.9 F (36.6 C)   Resp 15   Ht 5' 2.99" (1.6 m)   Wt 249 lb 9.6 oz (113.2 kg)   SpO2 98%   BMI 44.23 kg/m   Physical Exam General appearance - alert, well appearing, and in no distress and oriented to person, place, and time Mental status - alert, oriented to person, place, and time, normal mood, behavior, speech, dress, motor activity, and thought processes Eyes - pupils equal and reactive, extraocular eye movements intact Ears - bilateral TM's and external ear canals normal Nose - normal and patent, no erythema, discharge or polyps Mouth - mucous membranes moist, pharynx normal without lesions Neck - supple, no significant adenopathy Lymphatics - no palpable lymphadenopathy, no hepatosplenomegaly Chest - clear to auscultation, no wheezes, rales or rhonchi, symmetric air entry, no tachypnea, retractions or cyanosis Heart - normal rate, regular rhythm, normal S1, S2, no murmurs, rubs, clicks  or gallops Abdomen - soft, nontender, nondistended, no masses or organomegaly Breasts - patient declines to have breast exam Pelvic - exam declined by the patient Back exam - full range of motion, no tenderness, palpable spasm or pain on motion Neurological - alert, oriented, normal speech, no focal findings or movement disorder noted, neck supple without rigidity, cranial nerves II through XII intact, DTR's normal and symmetric, motor and sensory grossly normal bilaterally, normal muscle tone, no tremors, strength 5/5 Musculoskeletal - no joint tenderness,  deformity or swelling, no muscular tenderness noted Extremities - peripheral pulses normal, no pedal edema, no clubbing or cyanosis Skin - normal coloration and turgor, no rashes, no suspicious skin lesions noted   ASSESSMENT AND PLAN: 1. Annual physical exam: - Counseled on 150 minutes of exercise per week as tolerated, healthy eating (including decreased daily intake of saturated fats, cholesterol, added sugars, sodium), STI prevention, and routine healthcare maintenance.  2. Screening for metabolic disorder: - CMP last obtained 04/12/2021. - Kidney function last obtained 02/28/2021.  3. Screening for deficiency anemia: - CBC to screen for anemia. - CBC  4. Diabetes mellitus screening: - Hemoglobin A1c last obtained 04/12/2021.  5. Screening cholesterol level: - Lipid panel to screen for high cholesterol.  - Lipid Panel  6. Thyroid disorder screen: - TSH last obtained 03/01/2021.  7. Elevated liver enzymes: - Chronic history of elevated liver enzymes.  - Referral to Hepatology for further evaluation and management. - Amb Referral to Hepatology    Patient was given the opportunity to ask questions.  Patient verbalized understanding of the plan and was able to repeat key elements of the plan. Patient was given clear instructions to go to Emergency Department or return to medical center if symptoms don't improve, worsen, or new problems develop.The patient verbalized understanding.   Orders Placed This Encounter  Procedures   CBC   Lipid Panel   Amb Referral to Hepatology    Follow-up with primary provider as scheduled.   Rema Fendt, NP

## 2021-05-17 ENCOUNTER — Ambulatory Visit (INDEPENDENT_AMBULATORY_CARE_PROVIDER_SITE_OTHER): Payer: Self-pay | Admitting: Family

## 2021-05-17 ENCOUNTER — Encounter: Payer: Self-pay | Admitting: Family

## 2021-05-17 ENCOUNTER — Other Ambulatory Visit: Payer: Self-pay

## 2021-05-17 VITALS — BP 105/73 | HR 86 | Temp 97.9°F | Resp 15 | Ht 62.99 in | Wt 249.6 lb

## 2021-05-17 DIAGNOSIS — Z13220 Encounter for screening for lipoid disorders: Secondary | ICD-10-CM

## 2021-05-17 DIAGNOSIS — Z131 Encounter for screening for diabetes mellitus: Secondary | ICD-10-CM

## 2021-05-17 DIAGNOSIS — Z13228 Encounter for screening for other metabolic disorders: Secondary | ICD-10-CM

## 2021-05-17 DIAGNOSIS — Z1329 Encounter for screening for other suspected endocrine disorder: Secondary | ICD-10-CM

## 2021-05-17 DIAGNOSIS — R748 Abnormal levels of other serum enzymes: Secondary | ICD-10-CM

## 2021-05-17 DIAGNOSIS — Z130 Encounter for screening for diseases of the blood and blood-forming organs and certain disorders involving the immune mechanism: Secondary | ICD-10-CM

## 2021-05-17 DIAGNOSIS — Z0000 Encounter for general adult medical examination without abnormal findings: Secondary | ICD-10-CM

## 2021-05-17 NOTE — Patient Instructions (Signed)
Preventive Care 21-23 Years Old, Female Preventive care refers to lifestyle choices and visits with your health care provider that can promote health and wellness. This includes: A yearly physical exam. This is also called an annual wellness visit. Regular dental and eye exams. Immunizations. Screening for certain conditions. Healthy lifestyle choices, such as: Eating a healthy diet. Getting regular exercise. Not using drugs or products that contain nicotine and tobacco. Limiting alcohol use. What can I expect for my preventive care visit? Physical exam Your health care provider may check your: Height and weight. These may be used to calculate your BMI (body mass index). BMI is a measurement that tells if you are at a healthy weight. Heart rate and blood pressure. Body temperature. Skin for abnormal spots. Counseling Your health care provider may ask you questions about your: Past medical problems. Family's medical history. Alcohol, tobacco, and drug use. Emotional well-being. Home life and relationship well-being. Sexual activity. Diet, exercise, and sleep habits. Work and work environment. Access to firearms. Method of birth control. Menstrual cycle. Pregnancy history. What immunizations do I need?  Vaccines are usually given at various ages, according to a schedule. Your health care provider will recommend vaccines for you based on your age, medicalhistory, and lifestyle or other factors, such as travel or where you work. What tests do I need?  Blood tests Lipid and cholesterol levels. These may be checked every 5 years starting at age 20. Hepatitis C test. Hepatitis B test. Screening Diabetes screening. This is done by checking your blood sugar (glucose) after you have not eaten for a while (fasting). STD (sexually transmitted disease) testing, if you are at risk. BRCA-related cancer screening. This may be done if you have a family history of breast, ovarian, tubal, or  peritoneal cancers. Pelvic exam and Pap test. This may be done every 3 years starting at age 21. Starting at age 30, this may be done every 5 years if you have a Pap test in combination with an HPV test. Talk with your health care provider about your test results, treatment options,and if necessary, the need for more tests. Follow these instructions at home: Eating and drinking  Eat a healthy diet that includes fresh fruits and vegetables, whole grains, lean protein, and low-fat dairy products. Take vitamin and mineral supplements as recommended by your health care provider. Do not drink alcohol if: Your health care provider tells you not to drink. You are pregnant, may be pregnant, or are planning to become pregnant. If you drink alcohol: Limit how much you have to 0-1 drink a day. Be aware of how much alcohol is in your drink. In the [place] one drink equals one 12 oz bottle of beer (355 mL), one 5 oz glass of wine (148 mL), or one 1 oz glass of hard liquor (44 mL).  Lifestyle Take daily care of your teeth and gums. Brush your teeth every morning and night with fluoride toothpaste. Floss one time each day. Stay active. Exercise for at least 30 minutes 5 or more days each week. Do not use any products that contain nicotine or tobacco, such as cigarettes, e-cigarettes, and chewing tobacco. If you need help quitting, ask your health care provider. Do not use drugs. If you are sexually active, practice safe sex. Use a condom or other form of protection to prevent STIs (sexually transmitted infections). If you do not wish to become pregnant, use a form of birth control. If you plan to become pregnant, see your health care   provider for a prepregnancy visit. Find healthy ways to cope with stress, such as: Meditation, yoga, or listening to music. Journaling. Talking to a trusted person. Spending time with friends and family. Safety Always wear your seat belt while driving or riding in a  vehicle. Do not drive: If you have been drinking alcohol. Do not ride with someone who has been drinking. When you are tired or distracted. While texting. Wear a helmet and other protective equipment during sports activities. If you have firearms in your house, make sure you follow all gun safety procedures. Seek help if you have been physically or sexually abused. What's next? Go to your health care provider once a year for an annual wellness visit. Ask your health care provider how often you should have your eyes and teeth checked. Stay up to date on all vaccines. This information is not intended to replace advice given to you by your health care provider. Make sure you discuss any questions you have with your healthcare provider. Document Revised: 07/22/2020 Document Reviewed: 08/05/2018 Elsevier Patient Education  2022 [employer].

## 2021-05-17 NOTE — Progress Notes (Signed)
.  Pt presents for annual physical exam  

## 2021-05-18 LAB — CBC
Hematocrit: 43.2 % (ref 34.0–46.6)
Hemoglobin: 13.9 g/dL (ref 11.1–15.9)
MCH: 27.9 pg (ref 26.6–33.0)
MCHC: 32.2 g/dL (ref 31.5–35.7)
MCV: 87 fL (ref 79–97)
Platelets: 470 10*3/uL — ABNORMAL HIGH (ref 150–450)
RBC: 4.98 x10E6/uL (ref 3.77–5.28)
RDW: 12 % (ref 11.7–15.4)
WBC: 12.6 10*3/uL — ABNORMAL HIGH (ref 3.4–10.8)

## 2021-05-18 LAB — LIPID PANEL
Chol/HDL Ratio: 4.7 ratio — ABNORMAL HIGH (ref 0.0–4.4)
Cholesterol, Total: 188 mg/dL (ref 100–199)
HDL: 40 mg/dL (ref >39)
LDL Chol Calc (NIH): 126 mg/dL — ABNORMAL HIGH (ref 0–99)
Triglycerides: 121 mg/dL (ref 0–149)
VLDL Cholesterol Cal: 22 mg/dL (ref 5–40)

## 2021-05-18 NOTE — Progress Notes (Signed)
WBC's, sometimes called "infection fighters", are higher than normal. The WBC's have been consistently higher than normal for at least 4 years.   Platelets higher than normal and have been consistently higher than normal for at least 4 years.  Referral to Hematology for further evaluation and management of elevated WBC's and elevated platelets. Their office should call patient within 2 weeks with appointment details. If the patient does not hear from their office please notify primary provider.   Cholesterol higher than expected. High cholesterol may increase risk of heart attack and/or stroke. Consider eating more fruits, vegetables, and lean baked meats such as chicken or fish. Moderate intensity exercise at least 150 minutes as tolerated per week may help as well. Encouraged to recheck in 3 to 6 months.

## 2021-06-05 ENCOUNTER — Other Ambulatory Visit: Payer: Self-pay | Admitting: *Deleted

## 2021-06-05 ENCOUNTER — Other Ambulatory Visit: Payer: Self-pay

## 2021-06-05 DIAGNOSIS — Z124 Encounter for screening for malignant neoplasm of cervix: Secondary | ICD-10-CM

## 2021-06-05 NOTE — Progress Notes (Signed)
Patient: Jasmine Decker           Date of Birth: 09-04-1996           MRN: [id number] Visit Date: 06/05/2021 PCP: Rema Fendt, NP  Cervical Cancer Screening Do you smoke?: No Have you ever had or been told you have an allergy to latex products?: No Marital status: Single Date of last pap smear:  (April 2021 ([facility])) Date of last menstrual period: 05/04/21 Number of pregnancies: 1 Number of births: 1 Have you ever had any of the following? Hysterectomy: No Tubal ligation (tubes tied): No Abnormal bleeding: No Abnormal pap smear: Yes Venereal warts: No A sex partner with venereal warts: No A high risk* sex partner: No  Cervical Exam  Abnormal Observations: Scant amount if white colored discharge observed next to cervix. Blood observed at cervical os that is consistent with the start of patients menstrual period per patient.  Recommendations: Last Pap smear was 06/06/2020 at the [facility] that was ASCUS with positive HPV per Murray Hodgkins, RN at the [facility]. Per patient her most recent Pap smear is the only abnormal Pap smear she has had. No Pap smear results are in Epic. Let patient know that if today's Pap smear is normal that her next Pap smear will be due in one year due to her last Pap smear was abnormal. Informed patient that will call her with results to her Pap smear within the next couple of weeks. Patient verbalized understanding.    Patient's History Patient Active Problem List   Diagnosis Date Noted   Amenorrhea 02/28/2021   Morbid obesity with BMI of 45.0-49.9, adult (HCC) 02/28/2021   Past Medical History:  Diagnosis Date   Gall stones     Family History  Problem Relation Age of Onset   Gallbladder disease Maternal Aunt        3 aunts have had gallbladder removal    Social History   Occupational History   Not on file  Tobacco Use   Smoking status: Passive Smoke Exposure - Never Smoker   Smokeless  tobacco: Never   Tobacco comments:    Brother does smoke around the home, patient denies smoking/drugs/alcohol  Vaping Use   Vaping Use: Never used  Substance and Sexual Activity   Alcohol use: Never   Drug use: Never   Sexual activity: Yes    Birth control/protection: None

## 2021-06-20 LAB — CYTOLOGY - PAP
Comment: NEGATIVE
Comment: NEGATIVE
HPV 16: NEGATIVE
HPV 18 / 45: NEGATIVE
High risk HPV: POSITIVE — AB

## 2021-06-20 NOTE — Progress Notes (Signed)
The HPV is positive, 16, 18/45-negative. Does this patient still need to repeat pap in 1 year or does she need a colpo? Thank you,  Clois Dupes, LPN

## 2021-06-24 ENCOUNTER — Telehealth: Payer: Self-pay

## 2021-06-24 NOTE — Telephone Encounter (Signed)
Via Natale Lay, [facility]), Patient informed pap results, LSIL/ HPV-Positive, per Dr. Jolayne Panther, repeat pap in 1 year. Patient verbalized understanding.

## 2021-12-08 NOTE — L&D Delivery Note (Signed)
OB/GYN Faculty Practice Delivery Note  Jasmine Decker is a 26 y.o. [id number] s/p SVD at [redacted]w[redacted]d. She was admitted for IOL due to maternal obesity (BMI 47).   ROM: 11h 11m with clear fluid GBS Status: Negative   Delivery Date/Time: 06/06/22 at 0144  Delivery: Called to room and patient was complete and pushing. Head delivered LOA. Loose nuchal cord present and reduced at the perineum. Shoulders and body delivered in usual fashion. Infant with spontaneous cry, placed on mother's abdomen, dried and stimulated. Cord clamped x 2 after 1-minute delay and cut by FOB under direct supervision. Cord blood drawn. Placenta delivered spontaneously with gentle cord traction. Fundus firm with massage and Pitocin. Labia, perineum, vagina, and cervix were inspected, and patient was found to have bilateral periurethral lacerations and a 1st degree perineal laceration. The right periurethral laceration and 1st degree perineal laceration were repaired with 4-0 Monocryl and found to be hemostatic. The left periurethral laceration was hemostatic, superficial, and not repaired.   Placenta: Intact, 3VC - sent to L&D  Complications: None  Lacerations: 1st degree perineal, bilateral periurethral  EBL: 75 cc Analgesia: Epidural   Infant: Viable female  APGARs 7 and 9    Evalina Field, MD OB/GYN Fellow, Faculty Practice

## 2021-12-24 ENCOUNTER — Encounter: Payer: Self-pay | Admitting: *Deleted

## 2021-12-24 DIAGNOSIS — Z3490 Encounter for supervision of normal pregnancy, unspecified, unspecified trimester: Secondary | ICD-10-CM | POA: Insufficient documentation

## 2021-12-26 ENCOUNTER — Other Ambulatory Visit: Payer: Self-pay

## 2021-12-26 ENCOUNTER — Other Ambulatory Visit (HOSPITAL_COMMUNITY)
Admission: RE | Admit: 2021-12-26 | Discharge: 2021-12-26 | Disposition: A | Discharge status: Home / Self Care | Payer: Commercial Managed Care - PPO | Source: Ambulatory Visit | Attending: Obstetrics & Gynecology | Admitting: Obstetrics & Gynecology

## 2021-12-26 ENCOUNTER — Encounter: Payer: Self-pay | Admitting: Obstetrics & Gynecology

## 2021-12-26 ENCOUNTER — Ambulatory Visit (INDEPENDENT_AMBULATORY_CARE_PROVIDER_SITE_OTHER): Payer: Commercial Managed Care - PPO | Admitting: Obstetrics & Gynecology

## 2021-12-26 VITALS — BP 130/72 | HR 115 | Wt 252.0 lb

## 2021-12-26 DIAGNOSIS — O99210 Obesity complicating pregnancy, unspecified trimester: Secondary | ICD-10-CM | POA: Diagnosis not present

## 2021-12-26 DIAGNOSIS — O99712 Diseases of the skin and subcutaneous tissue complicating pregnancy, second trimester: Secondary | ICD-10-CM

## 2021-12-26 DIAGNOSIS — Z3A16 16 weeks gestation of pregnancy: Secondary | ICD-10-CM | POA: Diagnosis not present

## 2021-12-26 DIAGNOSIS — O09899 Supervision of other high risk pregnancies, unspecified trimester: Secondary | ICD-10-CM

## 2021-12-26 DIAGNOSIS — Z3490 Encounter for supervision of normal pregnancy, unspecified, unspecified trimester: Secondary | ICD-10-CM | POA: Diagnosis not present

## 2021-12-26 DIAGNOSIS — Z2839 Other underimmunization status: Secondary | ICD-10-CM

## 2021-12-26 DIAGNOSIS — L299 Pruritus, unspecified: Secondary | ICD-10-CM

## 2021-12-26 DIAGNOSIS — Z6841 Body Mass Index (BMI) 40.0 and over, adult: Secondary | ICD-10-CM

## 2021-12-26 MED ORDER — ASPIRIN EC 81 MG PO TBEC: 81.0000 mg | DELAYED_RELEASE_TABLET | Freq: Every day | ORAL | 11 refills | Status: DC

## 2021-12-26 NOTE — Progress Notes (Signed)
History:   Jasmine Decker is a 26 y.o. G2P1001 at [redacted]w[redacted]d by early [facility] being seen today for her first obstetrical visit.  Her obstetrical history is significant for  one term SVD without complications . Medical history remarkable for morbid obesity.  Patient does intend to breast feed. Pregnancy history fully reviewed.  Patient reports  itching in soles of foot and on torso for a few weeks. Thinks it is dry skin but wants to make sure it is nothing else. No rash reported .      HISTORY: OB History  Gravida Para Term Preterm AB Living  2 1 1  0 0 1  SAB IAB Ectopic Multiple Live Births  0 0 0 0 1    # Outcome Date GA Lbr Len/2nd Weight Sex Delivery Anes PTL Lv  2 Current           1 Term 02/14/17 [redacted]w[redacted]d 60:39 / 02:16 7 lb 9.6 oz (3.447 kg) F Vag-Spont EPI  LIV     Birth Comments: WNL     Name: Jasmine Decker     Apgar1: 8  Apgar5: 9    Last pap smear was done 05/2021 and was abnormal - LGSIL. Repeat recommended in one year.  Past Medical History:  Diagnosis Date   Gall stones    Morbid obesity (HCC)    Past Surgical History:  Procedure Laterality Date   CHOLECYSTECTOMY N/A 01/13/2015   Procedure: LAPAROSCOPIC CHOLECYSTECTOMY WITH POSSIBLE INTRAOPERATIVE CHOLANGIOGRAM;  Surgeon: 03/14/2015, MD;  Location: [facility] OR;  Service: General;  Laterality: N/A;   Family History  Problem Relation Age of Onset   Gallbladder disease Maternal Aunt        3 aunts have had gallbladder removal   Social History   Tobacco Use   Smoking status: Passive Smoke Exposure - Never Smoker   Smokeless tobacco: Never   Tobacco comments:    Brother does smoke around the home, patient denies smoking/drugs/alcohol  Vaping Use   Vaping Use: Never used  Substance Use Topics   Alcohol use: Never   Drug use: Never   No Known Allergies Current Outpatient Medications on File Prior to Visit  Medication Sig Dispense Refill   Prenatal Vit-Fe Fumarate-FA  (MULTIVITAMIN-PRENATAL) 27-0.8 MG TABS tablet Take 1 tablet by mouth daily at 12 noon.     No current facility-administered medications on file prior to visit.    Review of Systems Pertinent items noted in HPI and remainder of comprehensive ROS otherwise negative.  Physical Exam:   Vitals:   12/26/21 1003  BP: 130/72  Pulse: (!) 115  Weight: 252 lb (114.3 kg)   Fetal Heart Rate (bpm): 157  done on Bedside Ultrasound  General: well-developed, well-nourished female in no acute distress  Breasts:  deferred   Skin: normal coloration and turgor, no rashes  Neurologic: oriented, normal, negative, normal mood  Extremities: normal strength, tone, and muscle mass, ROM of all joints is normal  HEENT PERRLA, extraocular movement intact and sclera clear, anicteric  Neck supple and no masses  Cardiovascular: regular rate and rhythm  Respiratory:  no respiratory distress, normal breath sounds  Abdomen: soft, non-tender; bowel sounds normal; no masses,  no organomegaly  Pelvic: deferred    Assessment:    Pregnancy: G2P1001 Patient Active Problem List   Diagnosis Date Noted   Encounter for supervision of normal pregnancy 12/24/2021   Amenorrhea 02/28/2021   Maternal morbid obesity, antepartum (HCC) 02/28/2021  Plan:    1. Morbid obesity with BMI of 45.0-49.9, adult (HCC) Recommendations [X]  Aspirin 81 mg daily after 12 weeks; discontinue after 36 weeks [X]  Nutrition consult [X]  Weight gain 11-20 lbs for singleton recommended [X]  Screen for DM with A1C or early 2 hr GTT [ ]  Baseline and surveillance labs (pulled in from [facility], refresh links as needed)  Lab Results  Component Value Date   PLT 470 (H) 05/17/2021   CREATININE 0.52 04/12/2021   AST 90 (H) 04/12/2021   ALT 131 (H) 04/12/2021   Antenatal Testing: Not indicated.  [ ]  Growth scans every 4-6 weeks as needed (fundal height likely inadequate in morbidly obese patients) - TSH - Protein / creatinine ratio, urine -  aspirin EC 81 MG tablet; Take 1 tablet (81 mg total) by mouth daily. Swallow whole.  Dispense: 30 tablet; Refill: 11 - Amb Referral to Nutrition and Diabetic Education - [facility] MFM OB DETAIL +14 WK; Future - Comprehensive metabolic panel  2. Pruritus of pregnancy in second trimester Had elevated LFTs last year in 04/2021, but had cholescystectomy in 01/13/2015. Will follow up CMET and bile acids today,will follow up results and manage accordingly.  In the meantime, recommended moisturizer creams. - Bile acids, total  3. [redacted] weeks gestation of pregnancy 4. Encounter for supervision of normal pregnancy, antepartum, unspecified gravidity - Culture, OB Urine - GC/Chlamydia probe amp ([place])not at [facility] - Obstetric panel - HIV antibody (with reflex) - Hepatitis C Antibody - Hemoglobin A1c - Genetic Screening - Alpha fetoprotein, maternal - Enroll Patient in PreNatal Babyscripts Initial labs drawn. Continue prenatal vitamins. Problem list reviewed and updated. Genetic Screening discussed, NIPS: ordered. Horizon also ordered for her and FOB (he reports history of AS trait). Ultrasound discussed; fetal anatomic survey: ordered. Anticipatory guidance about prenatal visits given including labs, ultrasounds, and testing. Discussed usage of Babyscripts and virtual visits as additional source of managing and completing prenatal visits in midst of coronavirus and pandemic.   Encouraged to complete MyChart Registration for her ability to review results, send requests, and have questions addressed.  The nature of [place] [facility] Healthcare/Faculty Practice with multiple MDs and Advanced Practice Providers was explained to patient; also emphasized that residents, students are part of our team. Routine obstetric precautions reviewed. Encouraged to seek out care at office or emergency room [facility] preferred) for urgent and/or emergent concerns. Return in about 4 weeks (around 01/23/2022) for  OFFICE OB VISIT (MD or APP).     05/2021, MD, FACOG Obstetrician & Gynecologist, [facility] for [facility], [facility]

## 2021-12-26 NOTE — Progress Notes (Signed)
FOB Sickle cell trait Last pap- 06/05/21- abnormal LSIL and HRHPV- told to repeat in a year PHQ 9 Score: 4 GAD: Score 0 Pt declined flu shot Pt would like to do NIPS

## 2021-12-27 ENCOUNTER — Encounter: Payer: Self-pay | Admitting: Obstetrics & Gynecology

## 2021-12-27 LAB — GC/CHLAMYDIA PROBE AMP (~~LOC~~) NOT AT ARMC
Chlamydia: NEGATIVE
Comment: NEGATIVE
Comment: NORMAL
Neisseria Gonorrhea: NEGATIVE

## 2021-12-27 NOTE — Progress Notes (Signed)
Liver enzymes are elevated, AST/ALT 64/61(twice the normal limit). She had history of elevated LFTs in 200s prior to cholecystectomy in 2016, but the elevation persisted afterwards. Last check in 04/2021 showed AST/ALT 90/131, she had negative acute hepatitis panel and was referred to Hepatology (no encounter seen).  No reported drug or alcohol use.  Will continue to monitor closely, may need referral if they continue to be elevated. Also awaiting bile acid level, this was done due to report of itching even though her gestation is on the early side for cholestasis presentation.  Please call to inform patient of results and recommendations.    Jaynie Collins, MD

## 2021-12-29 LAB — URINE CULTURE, OB REFLEX

## 2021-12-29 LAB — CULTURE, OB URINE

## 2021-12-30 DIAGNOSIS — Z2839 Other underimmunization status: Secondary | ICD-10-CM | POA: Insufficient documentation

## 2021-12-30 DIAGNOSIS — O09899 Supervision of other high risk pregnancies, unspecified trimester: Secondary | ICD-10-CM | POA: Insufficient documentation

## 2021-12-31 ENCOUNTER — Ambulatory Visit: Payer: Commercial Managed Care - PPO | Admitting: Registered"

## 2021-12-31 LAB — PROTEIN / CREATININE RATIO, URINE
Creatinine, Urine: 33 mg/dL (ref 20–275)
Total Protein, Urine: <4 mg/dL — ABNORMAL LOW (ref 5–24)

## 2021-12-31 LAB — ALPHA FETOPROTEIN, MATERNAL
AFP MoM: 1.66
AFP, Serum: 37.9 ng/mL
Calc'd Gestational Age: 16 weeks
Maternal Wt: 252 [lb_av]
Risk for ONTD: 1
Twins-AFP: 1

## 2021-12-31 LAB — OBSTETRIC PANEL
Absolute Monocytes: 713 cells/uL (ref 200–950)
Antibody Screen: NOT DETECTED
Basophils Absolute: 11 cells/uL (ref 0–200)
Basophils Relative: 0.1 %
Eosinophils Absolute: 65 cells/uL (ref 15–500)
Eosinophils Relative: 0.6 %
HCT: 32.9 % — ABNORMAL LOW (ref 35.0–45.0)
Hemoglobin: 10.7 g/dL — ABNORMAL LOW (ref 11.7–15.5)
Hepatitis B Surface Ag: NONREACTIVE
Lymphs Abs: 2862 cells/uL (ref 850–3900)
MCH: 28.1 pg (ref 27.0–33.0)
MCHC: 32.5 g/dL (ref 32.0–36.0)
MCV: 86.4 fL (ref 80.0–100.0)
MPV: 10.9 fL (ref 7.5–12.5)
Monocytes Relative: 6.6 %
Neutro Abs: 7150 cells/uL (ref 1500–7800)
Neutrophils Relative %: 66.2 %
Platelets: 397 10*3/uL (ref 140–400)
RBC: 3.81 10*6/uL (ref 3.80–5.10)
RDW: 12.6 % (ref 11.0–15.0)
RPR Ser Ql: NONREACTIVE
Rubella: <0.9 Index — ABNORMAL LOW
Total Lymphocyte: 26.5 %
WBC: 10.8 10*3/uL (ref 3.8–10.8)

## 2021-12-31 LAB — COMPREHENSIVE METABOLIC PANEL
AG Ratio: 1.1 (calc) (ref 1.0–2.5)
ALT: 61 U/L — ABNORMAL HIGH (ref 6–29)
AST: 64 U/L — ABNORMAL HIGH (ref 10–30)
Albumin: 3.9 g/dL (ref 3.6–5.1)
Alkaline phosphatase (APISO): 55 U/L (ref 31–125)
BUN/Creatinine Ratio: 21 (calc) (ref 6–22)
BUN: 8 mg/dL (ref 7–25)
CO2: 21 mmol/L (ref 20–32)
Calcium: 9.5 mg/dL (ref 8.6–10.2)
Chloride: 107 mmol/L (ref 98–110)
Creat: 0.39 mg/dL — ABNORMAL LOW (ref 0.50–0.96)
Globulin: 3.5 g/dL (calc) (ref 1.9–3.7)
Glucose, Bld: 97 mg/dL (ref 65–99)
Potassium: 4 mmol/L (ref 3.5–5.3)
Sodium: 136 mmol/L (ref 135–146)
Total Bilirubin: 0.6 mg/dL (ref 0.2–1.2)
Total Protein: 7.4 g/dL (ref 6.1–8.1)

## 2021-12-31 LAB — TIQ-AOE

## 2021-12-31 LAB — HEMOGLOBIN A1C
Hgb A1c MFr Bld: 5.3 % of total Hgb (ref <5.7)
Mean Plasma Glucose: 105 mg/dL
eAG (mmol/L): 5.8 mmol/L

## 2021-12-31 LAB — HIV ANTIBODY (ROUTINE TESTING W REFLEX): HIV 1&2 Ab, 4th Generation: NONREACTIVE

## 2021-12-31 LAB — BILE ACIDS, TOTAL

## 2021-12-31 LAB — TSH: TSH: 1.66 mIU/L

## 2021-12-31 LAB — HEPATITIS C ANTIBODY
Hepatitis C Ab: NONREACTIVE
SIGNAL TO CUT-OFF: 0.15 (ref <1.00)

## 2022-01-07 ENCOUNTER — Other Ambulatory Visit: Payer: Self-pay

## 2022-01-14 ENCOUNTER — Encounter: Payer: Self-pay | Admitting: *Deleted

## 2022-01-14 NOTE — Progress Notes (Signed)
FOB Apolinar Junes is carrier for [facility] Disease.  Scanned and routed to DR Para March

## 2022-01-17 ENCOUNTER — Ambulatory Visit: Payer: Commercial Managed Care - PPO | Attending: Obstetrics & Gynecology

## 2022-01-17 ENCOUNTER — Encounter: Payer: Self-pay | Admitting: *Deleted

## 2022-01-17 ENCOUNTER — Other Ambulatory Visit: Payer: Self-pay

## 2022-01-17 ENCOUNTER — Ambulatory Visit: Payer: Commercial Managed Care - PPO | Admitting: *Deleted

## 2022-01-17 ENCOUNTER — Other Ambulatory Visit: Payer: Self-pay | Admitting: *Deleted

## 2022-01-17 VITALS — BP 123/64 | HR 110

## 2022-01-17 DIAGNOSIS — Z6841 Body Mass Index (BMI) 40.0 and over, adult: Secondary | ICD-10-CM

## 2022-01-17 DIAGNOSIS — Z3689 Encounter for other specified antenatal screening: Secondary | ICD-10-CM

## 2022-01-17 DIAGNOSIS — Z3A19 19 weeks gestation of pregnancy: Secondary | ICD-10-CM | POA: Diagnosis not present

## 2022-01-17 DIAGNOSIS — O99212 Obesity complicating pregnancy, second trimester: Secondary | ICD-10-CM

## 2022-01-17 DIAGNOSIS — O99210 Obesity complicating pregnancy, unspecified trimester: Secondary | ICD-10-CM | POA: Diagnosis present

## 2022-01-24 ENCOUNTER — Ambulatory Visit (INDEPENDENT_AMBULATORY_CARE_PROVIDER_SITE_OTHER): Payer: Commercial Managed Care - PPO | Admitting: Certified Nurse Midwife

## 2022-01-24 ENCOUNTER — Other Ambulatory Visit: Payer: Self-pay

## 2022-01-24 VITALS — BP 118/74 | HR 99 | Wt 250.0 lb

## 2022-01-24 DIAGNOSIS — Z3A20 20 weeks gestation of pregnancy: Secondary | ICD-10-CM

## 2022-01-24 DIAGNOSIS — R748 Abnormal levels of other serum enzymes: Secondary | ICD-10-CM

## 2022-01-24 DIAGNOSIS — Z3482 Encounter for supervision of other normal pregnancy, second trimester: Secondary | ICD-10-CM

## 2022-01-24 NOTE — Progress Notes (Signed)
Subjective:  Jasmine Decker is a 26 y.o. G2P1001 at 29w1dbeing seen today for ongoing prenatal care.  She is currently monitored for the following issues for this high-risk pregnancy and has Elevated liver enzymes; Amenorrhea; Maternal morbid obesity, [place] ([place]; Encounter for supervision of normal pregnancy; and Rubella non-immune status, antepartum on their problem list.  Patient reports no complaints.  Contractions: Not present. Vag. Bleeding: None.  Movement: Present. Denies leaking of fluid.   The following portions of the patient's history were reviewed and updated as appropriate: allergies, current medications, past family history, past medical history, past social history, past surgical history and problem list. Problem list updated.  Objective:   Vitals:   01/24/22 0904  BP: 118/74  Pulse: 99  Weight: 250 lb (113.4 kg)    Fetal Status: Fetal Heart Rate (bpm): 146   Movement: Present     General:  Alert, oriented and cooperative. Patient is in no acute distress.  Skin: Skin is warm and dry. No rash noted.   Cardiovascular: Normal heart rate noted  Respiratory: Normal respiratory effort, no problems with respiration noted  Abdomen: Soft, gravid, appropriate for gestational age. Pain/Pressure: Absent     Pelvic: Vag. Bleeding: None Vag D/C Character: Thin   Cervical exam deferred        Extremities: Normal range of motion.  Edema: None  Mental Status: Normal mood and affect. Normal behavior. Normal judgment and thought content.   Urinalysis:      Assessment and Plan:  Pregnancy: G2P1001 at 220w1d1. Encounter for supervision of other normal pregnancy in second trimester -reinterated need to start bASA  2. Elevated liver enzymes - Comp Met (CMET)  3. [redacted] weeks gestation of pregnancy   Preterm labor symptoms and general obstetric precautions including but not limited to vaginal bleeding, contractions, leaking of fluid and fetal movement were reviewed in detail with  the patient. Please refer to After Visit Summary for other counseling recommendations.  Return in about 4 weeks (around 02/21/2022).   BhJulianne HandlerCNM

## 2022-01-25 LAB — COMPREHENSIVE METABOLIC PANEL
AG Ratio: 1.1 (calc) (ref 1.0–2.5)
ALT: 43 U/L — ABNORMAL HIGH (ref 6–29)
AST: 60 U/L — ABNORMAL HIGH (ref 10–30)
Albumin: 3.8 g/dL (ref 3.6–5.1)
Alkaline phosphatase (APISO): 57 U/L (ref 31–125)
BUN/Creatinine Ratio: 11 (calc) (ref 6–22)
BUN: 6 mg/dL — ABNORMAL LOW (ref 7–25)
CO2: 21 mmol/L (ref 20–32)
Calcium: 9.4 mg/dL (ref 8.6–10.2)
Chloride: 103 mmol/L (ref 98–110)
Creat: 0.54 mg/dL (ref 0.50–0.96)
Globulin: 3.4 g/dL (calc) (ref 1.9–3.7)
Glucose, Bld: 94 mg/dL (ref 65–139)
Potassium: 4.3 mmol/L (ref 3.5–5.3)
Sodium: 135 mmol/L (ref 135–146)
Total Bilirubin: 0.6 mg/dL (ref 0.2–1.2)
Total Protein: 7.2 g/dL (ref 6.1–8.1)

## 2022-01-29 ENCOUNTER — Other Ambulatory Visit: Payer: Self-pay | Admitting: *Deleted

## 2022-01-29 DIAGNOSIS — R748 Abnormal levels of other serum enzymes: Secondary | ICD-10-CM

## 2022-01-29 NOTE — Progress Notes (Signed)
Amb referral placed to Hepatology per [facility]

## 2022-01-30 ENCOUNTER — Encounter: Payer: Self-pay | Admitting: Certified Nurse Midwife

## 2022-01-30 ENCOUNTER — Other Ambulatory Visit: Payer: Self-pay | Admitting: *Deleted

## 2022-01-30 DIAGNOSIS — R748 Abnormal levels of other serum enzymes: Secondary | ICD-10-CM

## 2022-01-30 DIAGNOSIS — Z3482 Encounter for supervision of other normal pregnancy, second trimester: Secondary | ICD-10-CM

## 2022-01-30 DIAGNOSIS — O289 Unspecified abnormal findings on antenatal screening of mother: Secondary | ICD-10-CM | POA: Insufficient documentation

## 2022-01-30 NOTE — Progress Notes (Signed)
MFM referral placed per [facility]

## 2022-01-31 ENCOUNTER — Encounter: Payer: Self-pay | Admitting: Nurse Practitioner

## 2022-01-31 ENCOUNTER — Other Ambulatory Visit: Payer: Self-pay

## 2022-01-31 DIAGNOSIS — R748 Abnormal levels of other serum enzymes: Secondary | ICD-10-CM

## 2022-01-31 DIAGNOSIS — O289 Unspecified abnormal findings on antenatal screening of mother: Secondary | ICD-10-CM

## 2022-01-31 NOTE — Progress Notes (Signed)
Referral to MFM genetic counselor placed per Donette Larry, CNM

## 2022-02-12 ENCOUNTER — Encounter: Payer: Commercial Managed Care - PPO | Attending: Obstetrics & Gynecology | Admitting: Registered"

## 2022-02-12 ENCOUNTER — Ambulatory Visit: Payer: Commercial Managed Care - PPO | Admitting: Nurse Practitioner

## 2022-02-12 ENCOUNTER — Encounter: Payer: Self-pay | Admitting: Registered"

## 2022-02-12 ENCOUNTER — Ambulatory Visit: Payer: Commercial Managed Care - PPO | Admitting: Physician Assistant

## 2022-02-12 ENCOUNTER — Other Ambulatory Visit: Payer: Self-pay

## 2022-02-12 DIAGNOSIS — E669 Obesity, unspecified: Secondary | ICD-10-CM | POA: Insufficient documentation

## 2022-02-12 NOTE — Patient Instructions (Signed)
-   Aim to substitute salt as seasoning with salt-free options such as Mrs. Dash or other herbs and spices.  ? ?- Aim to have small meals throughout the day.  ? ?- Increase fruit and vegetable intake.  ? ?- See handout for guide related to meals, snacks, and times discussed to eat on workdays and non-workdays.  ? ?- Keep up the great work with physical activity. Aim to increase to 5 days/week.  ?

## 2022-02-12 NOTE — Progress Notes (Signed)
Medical Nutrition Therapy  ?Appointment Start time:  9:50  Appointment End time:  10:51 ? ?Primary concerns today: none stated  ?Referral diagnosis: obesity ?Preferred learning style: no preference indicated ?Learning readiness: ready, change in progress ? ? ?NUTRITION ASSESSMENT  ? ?Pt states she is currently pregnant and due date is 06/12/2022. States she works 3rd shift, Sun-Wed 10 pm - 7 am, works about 40 hours/week. 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 States she has been working there for 2 years.  ?States she will stay up all day on Thursdays to adjust sleep schedule to sleep at night. States she is so tired now since being pregnant and will sleep all day on Friday. States she has blackout curtains to help with sleeping during the day.  ? ?States her schedule on workdays typically look like:  ?Wakes up at 1:41 pm to pick up daughter at 2:30 pm.  ?Goes to park  with daughter 3-4 pm.  ?Cooks dinner 4-5 pm.  ?Dinner with family at 5:30 pm . Watched tv and spend time with family.  ?8:30 pm - takes daughter to mom's.  ?Eats lunch at work at 3 am.  ?Gets off work at 7 am and goes to [facility].  ?Takes daughter to school.  ?8:30 am -  eats snack ? ?States her main source of protein is chicken and seafood (2x/month). States she does not eat red meat.  ?States she has WIC so it makes it easier to buy whole grains options.  ? ?States she has a 48 years old daughter.  ? ? ?Clinical ?Medical Hx: none reported ?Medications: See list ?Labs: elevated AST (64), elevated ALT (61) ?Notable Signs/Symptoms: none ? ?Lifestyle & Dietary Hx ? ?Estimated daily fluid intake: 96 oz ?Supplements: See list ?Sleep: 6-7 hrs ?Stress / self-care: none reported ?Current average weekly physical activity: walks at park with daughter 90 min 3x/week ? ?24-Hr Dietary Recall ?First Meal (5:30 pm): Stir-fry chicken breasts + broccoli + green beans + peas + carrots with baked potato fries ?Snack:  ?Second Meal (3 am): cheese crackers + tuna +  tostadas ?Snack:  ?Third Meal: skips ?Snack (8:30 am): cucumber + carrots + string cheese + ranch ?Beverages: water (3*32 oz; 96 oz) ? ? ?NUTRITION DIAGNOSIS  ?NB-1.1 Food and nutrition-related knowledge deficit As related to preeclampsia.  As evidenced by verbalized lack of previous nutrition-related education. ? ? ?NUTRITION INTERVENTION  ?Nutrition education (E-1) on the following topics:  ?Nutrition education and counseling. Pt was educated and counseled on hypertension during pregnancy, nutritional ways to lower blood pressure, ways to increase fiber, vitamin, and mineral intake, and ways to increase physical activity. Discussed importance of having small meals throughout the day. Pt agreed with goals listed. ? ?Handouts Provided Include  ?Preeclampsia/Eclampsia Nutrition Therapy and Meal Planning Tips ? ?Learning Style & Readiness for Change ?Teaching method utilized: Visual & Auditory  ?Demonstrated degree of understanding via: Teach Back  ?Barriers to learning/adherence to lifestyle change: work-life balance ? ?Goals Established by Pt ?Aim to substitute salt as seasoning with salt-free options such as Mrs. Dash or other herbs and spices.  ?Aim to have small meals throughout the day.  ?Increase fruit and vegetable intake.  ?See handout for guide related to meals, snacks, and times discussed to eat on workdays and non-workdays.  ?Keep up the great work with physical activity. Aim to increase to 5 days/week.  ? ? ?MONITORING & EVALUATION ?Dietary intake, weekly physical activity. ? ?Next Steps  ?Patient is to follow-up prn. ?

## 2022-02-20 ENCOUNTER — Encounter: Payer: Self-pay | Admitting: Nurse Practitioner

## 2022-02-20 ENCOUNTER — Other Ambulatory Visit (INDEPENDENT_AMBULATORY_CARE_PROVIDER_SITE_OTHER): Payer: Commercial Managed Care - PPO

## 2022-02-20 ENCOUNTER — Ambulatory Visit: Payer: Commercial Managed Care - PPO | Admitting: Nurse Practitioner

## 2022-02-20 VITALS — BP 102/62 | HR 94 | Ht 63.0 in | Wt 255.0 lb

## 2022-02-20 DIAGNOSIS — R7989 Other specified abnormal findings of blood chemistry: Secondary | ICD-10-CM

## 2022-02-20 LAB — IBC + FERRITIN
Ferritin: 26.5 ng/mL (ref 10.0–291.0)
Iron: 73 ug/dL (ref 42–145)
Saturation Ratios: 12 % — ABNORMAL LOW (ref 20.0–50.0)
TIBC: 606.2 ug/dL — ABNORMAL HIGH (ref 250.0–450.0)
Transferrin: 433 mg/dL — ABNORMAL HIGH (ref 212.0–360.0)

## 2022-02-20 NOTE — Progress Notes (Signed)
? ? ?Assessment and Plan:   ? ?# 26 yo female referred for elevated liver enzymes. Abdominal pain and elevated liver enzymes in 2014, resolved . No clear cut cause determined. . Recurrent abdominal pain and elevated liver enzymes in 2016. Underwent laparascopic  cholecystectomy for symptomatic cholelithiasis.  Milder elevation in liver enzymes ( asymptomatic) over the last year (2-3 x uln) Negative viral hepatitis studies. 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 ?# [redacted] weeks gestation. ? ?History of Present Illness:  ? ?Patient profile:  ?Jasmine Decker is a 26 y.o. female new to practice referred for elevated LFTs. She has a  past medical history significant for obesity cholelithiasis s/p cholecystectomy. See PMH below for any additional history. ? ?Chief complaint:  none from patient ? ?Der has a history of elevated liver enzymes dating back to 2014 at which time AST was 2600 / ALT 1900. She was admitted to hospital with abdominal pain . [place] was negative. Acute hepatitis panel negative. Pediatric surgery evaluated but pain improved and she was discharged home. Liver enzymes became elevated again in the 2016. She was admitted to hospital with abdominal pain.  US showed cholelithiasis without acute cholecystitis. She underwent a laparoscopic cholecystectomy.  Pathology confirmed cholelithiasis and chronic cholecystitis. No labs in epic following that until May 2022 when AST was 90 / ALT 131. Enzymes have remained elevated but to a lesser degree in the 40-60's.  No liver disease in family. She takes a daily baby asa plus Tylenol and Advil but no more than once a month. Non-drinker. She feels fine. No abdominal pain. No dark urine, jaundice.  ? ?Patient is [redacted] weeks gestation. She had a baby in 2018. Doesn't recall having any liver issues with first pregnancy in [place] 2018 ? ? ? Latest Reference Range & Units 04/12/21  00:00  ?Hep A Ab, IgM NON-REACTIVE  NON-REACTIVE  ? ? ? Latest Reference Range & Units 12/26/21 11:03  ?Hepatitis B Surface Ag NON-REACTIVE  NON-REACTIVE  ?Hepatitis C Ab NON-REACTIVE  NON-REACTIVE  ?SIGNAL TO CUT-OFF <1.00  0.15  ?HIV NON-REACTIVE  NON-REACTIVE  ? ? ?Previous Labs / Imaging:: ?CBC Latest Ref Rng & Units 12/26/2021 05/17/2021 02/28/2021  ?WBC 3.8 - 10.8 Thousand/uL 10.8 12.6(H) 12.4(H)  ?Hemoglobin 11.7 - 15.5 g/dL 10.7(L) 13.9 13.4  ?Hematocrit 35.0 - 45.0 % 32.9(L) 43.2 41.3  ?Platelets 140 - 400 Thousand/uL 397 470(H) 451(H)  ? ? ?Lab Results  ?Component Value Date  ? LIPASE 27 01/12/2015  ? ?CMP Latest Ref Rng & Units 01/24/2022 12/26/2021 04/12/2021  ?Glucose 65 - 139 mg/dL 94 97 93  ?BUN 7 - 25 mg/dL 6(L) 8 8  ?Creatinine 0.50 - 0.96 mg/dL 2.42 3.53(I) 1.44  ?Sodium 135 - 146 mmol/L 135 136 136  ?Potassium 3.5 - 5.3 mmol/L 4.3 4.0 4.3  ?Chloride 98 - 110 mmol/L 103 107 104  ?CO2 20 - 32 mmol/L 21 21 24   ?Calcium 8.6 - 10.2 mg/dL 9.4 9.5 9.6  ?Total Protein 6.1 - 8.1 g/dL 7.2 7.4 )  ?Total Bilirubin 0.2 - 1.2 mg/dL 0.6 0.6 3.1(V)  ?Alkaline Phos 44 - 121 IU/L - - -  ?AST 10 - 30 U/L 60(H) 64(H) 90(H)  ?ALT 6 - 29 U/L 43(H) 61(H) 131(H)  ? ? ? ?Past Medical History:  ?Diagnosis Date  ? Gall stones   ? Morbid obesity (HCC)   ? ?Past Surgical History:  ?Procedure Laterality Date  ?  CHOLECYSTECTOMY N/A 01/13/2015  ? Procedure: LAPAROSCOPIC CHOLECYSTECTOMY WITH POSSIBLE INTRAOPERATIVE CHOLANGIOGRAM;  Surgeon: Abigail Miyamoto, MD;  Location: [facility] OR;  Service: General;  Laterality: N/A;  ? ?Family History  ?Problem Relation Age of Onset  ? Gallbladder disease Maternal Aunt   ?     3 aunts have had gallbladder removal  ? Liver disease Neg Hx   ? ?Social History  ? ?Tobacco Use  ? Smoking status: Never  ?  Passive exposure: Yes  ? Smokeless tobacco: Never  ? Tobacco comments:  ?  Brother does smoke around the home, patient denies smoking/drugs/alcohol  ?Vaping Use  ? Vaping Use: Never used  ?Substance Use  Topics  ? Alcohol use: Never  ? Drug use: Never  ? ?Current Outpatient Medications  ?Medication Sig Dispense Refill  ? aspirin EC 81 MG tablet Take 1 tablet (81 mg total) by mouth daily. Swallow whole. 30 tablet 11  ? Prenatal Vit-Fe Fumarate-FA (MULTIVITAMIN-PRENATAL) 27-0.8 MG TABS tablet Take 1 tablet by mouth daily at 12 noon.    ? ?No current facility-administered medications for this visit.  ? ?No Known Allergies ? ? ?Review of Systems: ?All systems reviewed and negative except where noted in HPI.  ? ?Physical Exam:   ? ?Wt Readings from Last 3 Encounters:  ?02/20/22 255 lb (115.7 kg)  ?01/24/22 250 lb (113.4 kg)  ?12/26/21 252 lb (114.3 kg)  ? ? ?BP 102/62   Pulse 94   Ht 5\' 3"  (1.6 m)   Wt 255 lb (115.7 kg)   LMP 09/05/2021   BMI 45.17 kg/m?  ?Constitutional:  Generally well appearing female in no acute distress. ?Psychiatric: Pleasant. Normal mood and affect. Behavior is normal. ?EENT: Pupils normal.  Conjunctivae are normal. No scleral icterus. ?Neck supple.  ?Cardiovascular: Normal rate, regular rhythm. No edema ?Pulmonary/chest: Effort normal and breath sounds normal. No wheezing, rales or rhonchi. ?Abdominal: Soft, nondistended, nontender. Bowel sounds active throughout. There are no masses palpable. No hepatomegaly. ?Neurological: Alert and oriented to person place and time. ?Skin: Skin is warm and dry. No rashes noted. ? ?09/07/2021, NP  02/20/2022, 10:39 AM ? ?Cc:  ?Referring Provider ?02/22/2022, CNMfe ? ? ? ? ? ? ? ?

## 2022-02-20 NOTE — Progress Notes (Signed)
Addendum: Reviewed and agree with assessment and management plan. Tramain Gershman M, MD  

## 2022-02-20 NOTE — Patient Instructions (Signed)
If you are age 24 or older, your body mass index should be between 23-30. Your Body mass index is 45.17 kg/m?Marland Kitchen If this is out of the aforementioned range listed, please consider follow up with your Primary Care Provider. ? ?If you are age 23 or younger, your body mass index should be between 19-25. Your Body mass index is 45.17 kg/m?Marland Kitchen If this is out of the aformentioned range listed, please consider follow up with your Primary Care Provider.  ? ?Your provider has requested that you go to the basement level for lab work before leaving today. Press "B" on the elevator. The lab is located at the first door on the left as you exit the elevator. ? ? ?You have been scheduled for an abdominal ultrasound at [facility] (1st floor of hospital) on 02/27/22 at 8am. Please arrive 15 minutes prior to your appointment for registration. Make certain not to have anything to eat or drink 6 hours prior to your appointment. Should you need to reschedule your appointment, please contact radiology at [phone]. This test typically takes about 30 minutes to perform. ? ?Your provider has requested that you go to the basement level for lab work before leaving today. Press "B" on the elevator. The lab is located at the first door on the left as you exit the elevator.  ? ? ?The [facility] providers would like to encourage you to use [facility] to communicate with providers for non-urgent requests or questions.  Due to long hold times on the telephone, sending your provider a message by [facility] may be a faster and more efficient way to get a response.  Please allow 48 business hours for a response.  Please remember that this is for non-urgent requests.  ? ?It was a pleasure to see you today! ? ?Thank you for trusting me with your gastrointestinal care!   ? ?Tye Savoy, NP  ? ?

## 2022-02-21 ENCOUNTER — Ambulatory Visit: Payer: Commercial Managed Care - PPO

## 2022-02-21 ENCOUNTER — Ambulatory Visit: Payer: Commercial Managed Care - PPO | Admitting: *Deleted

## 2022-02-21 ENCOUNTER — Other Ambulatory Visit: Payer: Self-pay

## 2022-02-21 ENCOUNTER — Encounter: Payer: Self-pay | Admitting: *Deleted

## 2022-02-21 ENCOUNTER — Encounter: Payer: Self-pay | Admitting: Genetics

## 2022-02-21 ENCOUNTER — Ambulatory Visit: Payer: Commercial Managed Care - PPO | Attending: Obstetrics | Admitting: Obstetrics

## 2022-02-21 ENCOUNTER — Ambulatory Visit: Payer: Commercial Managed Care - PPO | Attending: Obstetrics and Gynecology

## 2022-02-21 ENCOUNTER — Other Ambulatory Visit: Payer: Self-pay | Admitting: *Deleted

## 2022-02-21 VITALS — BP 122/74 | HR 101

## 2022-02-21 DIAGNOSIS — O280 Abnormal hematological finding on antenatal screening of mother: Secondary | ICD-10-CM | POA: Diagnosis not present

## 2022-02-21 DIAGNOSIS — Z3A24 24 weeks gestation of pregnancy: Secondary | ICD-10-CM

## 2022-02-21 DIAGNOSIS — O285 Abnormal chromosomal and genetic finding on antenatal screening of mother: Secondary | ICD-10-CM | POA: Diagnosis not present

## 2022-02-21 DIAGNOSIS — R7989 Other specified abnormal findings of blood chemistry: Secondary | ICD-10-CM

## 2022-02-21 DIAGNOSIS — Z362 Encounter for other antenatal screening follow-up: Secondary | ICD-10-CM | POA: Insufficient documentation

## 2022-02-21 DIAGNOSIS — O99212 Obesity complicating pregnancy, second trimester: Secondary | ICD-10-CM

## 2022-02-21 DIAGNOSIS — Z6841 Body Mass Index (BMI) 40.0 and over, adult: Secondary | ICD-10-CM

## 2022-02-21 NOTE — Progress Notes (Signed)
?  Genetic counseling briefly met with Antia Rahal and her reproductive partner after Joseline's ultrasound examination on 02/21/2022. The following is a summary of our discussion. ? ?Risk for Aneuploidy. Aloma has had two unsuccessful NIPS screens due to insufficient fetal fraction (2.7% and 2.6%). A low fetal fraction may be attributed to normal variation, gestational age, maternal weight, and certain medications. This finding may also be associated with some chromosome abnormalities. Genetic counseling reviewed with Breelle and her partner that the laboratory [facility]) will accept a third repeat sample. We discussed that given the advanced gestational age of the pregnancy there may be a higher fetal fraction, however, we cannot guarantee that a third screen will be successful. Genetic counseling also offered [place] amniocentesis for prenatal diagnosis in the form of a fetal karyotype and fetal microarray. Possible procedural difficulties and complications that can arise with amniocentesis include maternal infection, cramping, bleeding, fluid leakage, and/or pregnancy loss. The risk for pregnancy loss with an amniocentesis is 1/500. Marili declined amniocentesis for prenatal diagnosis. She also declined attempting NIPS for a third time. Takia and her partner shared with genetic counseling that results from these tests/screens would not change their commitment to the current pregnancy. ? ?Paternal Sickle Cell Trait. Krystyne's reproductive partner reports he is a known carrier for Sickle Cell Trait (Hb A/S). Records confirming his trait status are not available for genetic counseling to review. Genetic counseling reviewed with the couple that Angeles completed Horizon carrier screening through the laboratory [facility] and her result was screen negative for Beta Hemoglobinopathies (including Sickle Cell Disease), Alpha Thalassemia, Spinal Muscular Atrophy, and Cystic Fibrosis. Given Maigen's negative carrier screen it is highly  unlikely for any pregnancy this couple has together to be affected with these conditions.  ?  ? ?Thank you for sharing in the care of Nicolette with [place]. ?Please do not hesitate to contact us if you have any questions. ? ?Staci Righter, MS, CGC ?Certified Genetic Counselor ? ?

## 2022-02-21 NOTE — Progress Notes (Signed)
MFM Note ? ?Jasmine Decker was seen for a follow up exam due to maternal obesity.  Her pregnancy has been complicated by elevated liver function tests.  Her AST and ALT levels were 64 and 61 one month ago.  It appears that she has had mildly elevated liver function tests for at least the past 7 years.  She was seen by GI yesterday and is undergoing a full work-up for the elevated liver function tests.  She denies any itching symptoms that may indicate that she may be developing cholestasis of pregnancy. ? ?Her cell free DNA test has also indicated no results due to insufficient fetal DNA. ? ?She was informed that the fetal growth and amniotic fluid level appears appropriate for her gestational age. ? ?The patient was advised to follow-up with GI and complete the work-up for the elevated liver function tests.  As she has had elevated liver function tests for over 7 years, this is most likely a chronic condition and should not have any major effects on her pregnancy. ? ?The patient was advised that her cell free DNA test results showing insufficient fetal DNA was most likely due to maternal body habitus.   ? ?She was advised that she may either have a repeat cell free DNA test now that the fetus is larger and there may be more fetal DNA circulating in her blood, undergo an amniocentesis for definitive diagnosis of fetal chromosomal abnormalities, or do nothing and have the baby tested after birth if there are any suspicions of chromosomal abnormalities.  She declined an amniocentesis today.   ? ?After meeting with our genetic counselor, she decided that she did not want to have the cell free DNA test repeated and will have her baby tested after birth if there is any suspicion. ? ?Due to maternal obesity, we will continue to follow her with monthly growth ultrasounds.  Weekly fetal testing should be started at around 34 weeks.   ? ?A follow-up growth scan was scheduled in 4 weeks.   ? ?The patient stated that all of her  questions have been answered. ? ?A total of 20 minutes was spent counseling and coordinating the care for this patient.  Greater than 50% of the time was spent in direct face-to-face contact. ?

## 2022-02-24 ENCOUNTER — Ambulatory Visit (INDEPENDENT_AMBULATORY_CARE_PROVIDER_SITE_OTHER): Payer: Commercial Managed Care - PPO | Admitting: Obstetrics and Gynecology

## 2022-02-24 ENCOUNTER — Other Ambulatory Visit: Payer: Self-pay

## 2022-02-24 DIAGNOSIS — Z3482 Encounter for supervision of other normal pregnancy, second trimester: Secondary | ICD-10-CM

## 2022-02-24 NOTE — Progress Notes (Signed)
? ?  PRENATAL VISIT NOTE ? ?Subjective:  ?Jasmine Decker is a 26 y.o. G2P1001 at [redacted]w[redacted]d being seen today for ongoing prenatal care.  She is currently monitored for the following issues for this low-risk pregnancy and has Elevated liver enzymes; Amenorrhea; Maternal morbid obesity, antepartum ([place]); Encounter for supervision of normal pregnancy; Rubella non-immune status, antepartum; and Abnormal prenatal test on their problem list. ? ?Patient reports no complaints.  Contractions: Not present. Vag. Bleeding: None.  Movement: Present. Denies leaking of fluid.  ? ?The following portions of the patient's history were reviewed and updated as appropriate: allergies, current medications, past family history, past medical history, past social history, past surgical history and problem list.  ? ?Objective:  ? ?Vitals:  ? 02/24/22 0907  ?BP: 114/78  ?Pulse: 99  ?Weight: 253 lb (114.8 kg)  ? ? ?Fetal Status: Fetal Heart Rate (bpm): 143 Fundal Height: 25 cm Movement: Present    ? ?General:  Alert, oriented and cooperative. Patient is in no acute distress.  ?Skin: Skin is warm and dry. No rash noted.   ?Cardiovascular: Normal heart rate noted  ?Respiratory: Normal respiratory effort, no problems with respiration noted  ?Abdomen: Soft, gravid, appropriate for gestational age.  Pain/Pressure: Absent     ?Pelvic: Cervical exam deferred        ?Extremities: Normal range of motion.  Edema: None  ?Mental Status: Normal mood and affect. Normal behavior. Normal judgment and thought content.  ? ?Assessment and Plan:  ?Pregnancy: G2P1001 at [redacted]w[redacted]d ? ?1. Encounter for supervision of other normal pregnancy in second trimester ? ?- Discussed MMR PP ?- Continue Q4 week growth [place] d/t obesity ?- Continue BASA  ?- 2 hour GTT next visit. Discussed fasting.  ? ? ?Preterm labor symptoms and general obstetric precautions including but not limited to vaginal bleeding, contractions, leaking of fluid and fetal movement were reviewed in detail with the  patient. ?Please refer to After Visit Summary for other counseling recommendations.  ? ?Return in about 4 weeks (around 03/24/2022), or For 2 hour GTT. ? ?Future Appointments  ?Date Time Provider [place]  ?02/27/2022  8:00 AM WL-US 1 [facility]-US [place]  ?03/27/2022 10:45 AM [facility] NURSE [facility]  ?03/27/2022 11:00 AM [facility] US1 [facility]  ?03/28/2022  8:50 AM Julianne Handler, CNM [facility]  ? ? ?Noni Saupe, NP  ?

## 2022-02-26 LAB — ANTI-SMOOTH MUSCLE ANTIBODY, IGG: Actin (Smooth Muscle) Antibody (IGG): <20 U (ref <20)

## 2022-02-26 LAB — MITOCHONDRIAL ANTIBODIES: Mitochondrial M2 Ab, IgG: <=20 U (ref <=20.0)

## 2022-02-26 LAB — TISSUE TRANSGLUTAMINASE, IGA: (tTG) Ab, IgA: <1 U/mL

## 2022-02-26 LAB — ALPHA-1-ANTITRYPSIN: A-1 Antitrypsin, Ser: 239 mg/dL — ABNORMAL HIGH (ref 83–199)

## 2022-02-26 LAB — ANA: Anti Nuclear Antibody (ANA): NEGATIVE

## 2022-02-26 LAB — IGA: Immunoglobulin A: 206 mg/dL (ref 47–310)

## 2022-02-26 LAB — CERULOPLASMIN: Ceruloplasmin: 60 mg/dL — ABNORMAL HIGH (ref 18–53)

## 2022-02-27 ENCOUNTER — Ambulatory Visit (HOSPITAL_COMMUNITY)
Admission: RE | Admit: 2022-02-27 | Discharge: 2022-02-27 | Disposition: A | Discharge status: Home / Self Care | Payer: Commercial Managed Care - PPO | Source: Ambulatory Visit | Attending: Nurse Practitioner | Admitting: Nurse Practitioner

## 2022-02-27 ENCOUNTER — Other Ambulatory Visit: Payer: Self-pay

## 2022-02-27 DIAGNOSIS — R7989 Other specified abnormal findings of blood chemistry: Secondary | ICD-10-CM | POA: Insufficient documentation

## 2022-03-12 ENCOUNTER — Other Ambulatory Visit: Payer: Self-pay

## 2022-03-12 DIAGNOSIS — R7989 Other specified abnormal findings of blood chemistry: Secondary | ICD-10-CM

## 2022-03-27 ENCOUNTER — Encounter: Payer: Self-pay | Admitting: *Deleted

## 2022-03-27 ENCOUNTER — Ambulatory Visit: Payer: Commercial Managed Care - PPO | Attending: Obstetrics

## 2022-03-27 ENCOUNTER — Ambulatory Visit: Payer: Commercial Managed Care - PPO | Admitting: *Deleted

## 2022-03-27 ENCOUNTER — Other Ambulatory Visit: Payer: Self-pay | Admitting: *Deleted

## 2022-03-27 VITALS — BP 122/72 | HR 97

## 2022-03-27 DIAGNOSIS — O289 Unspecified abnormal findings on antenatal screening of mother: Secondary | ICD-10-CM | POA: Insufficient documentation

## 2022-03-27 DIAGNOSIS — Z3482 Encounter for supervision of other normal pregnancy, second trimester: Secondary | ICD-10-CM

## 2022-03-27 DIAGNOSIS — R7989 Other specified abnormal findings of blood chemistry: Secondary | ICD-10-CM

## 2022-03-27 DIAGNOSIS — O2690 Pregnancy related conditions, unspecified, unspecified trimester: Secondary | ICD-10-CM | POA: Diagnosis not present

## 2022-03-27 DIAGNOSIS — R748 Abnormal levels of other serum enzymes: Secondary | ICD-10-CM

## 2022-03-27 DIAGNOSIS — E669 Obesity, unspecified: Secondary | ICD-10-CM

## 2022-03-27 DIAGNOSIS — O285 Abnormal chromosomal and genetic finding on antenatal screening of mother: Secondary | ICD-10-CM

## 2022-03-27 DIAGNOSIS — O99213 Obesity complicating pregnancy, third trimester: Secondary | ICD-10-CM

## 2022-03-27 DIAGNOSIS — Z6841 Body Mass Index (BMI) 40.0 and over, adult: Secondary | ICD-10-CM

## 2022-03-27 DIAGNOSIS — O280 Abnormal hematological finding on antenatal screening of mother: Secondary | ICD-10-CM | POA: Diagnosis not present

## 2022-03-27 DIAGNOSIS — Z3A29 29 weeks gestation of pregnancy: Secondary | ICD-10-CM | POA: Diagnosis not present

## 2022-03-28 ENCOUNTER — Ambulatory Visit (INDEPENDENT_AMBULATORY_CARE_PROVIDER_SITE_OTHER): Payer: Commercial Managed Care - PPO | Admitting: Certified Nurse Midwife

## 2022-03-28 VITALS — BP 121/79 | HR 99 | Wt 255.0 lb

## 2022-03-28 DIAGNOSIS — Z3482 Encounter for supervision of other normal pregnancy, second trimester: Secondary | ICD-10-CM

## 2022-03-28 DIAGNOSIS — O99210 Obesity complicating pregnancy, unspecified trimester: Secondary | ICD-10-CM

## 2022-03-28 DIAGNOSIS — Z23 Encounter for immunization: Secondary | ICD-10-CM | POA: Diagnosis not present

## 2022-03-28 DIAGNOSIS — R748 Abnormal levels of other serum enzymes: Secondary | ICD-10-CM

## 2022-03-28 DIAGNOSIS — Z3A29 29 weeks gestation of pregnancy: Secondary | ICD-10-CM

## 2022-03-28 NOTE — Progress Notes (Signed)
Subjective:  ?Jasmine Decker is a 26 y.o. G2P1001 at [redacted]w[redacted]d being seen today for ongoing prenatal care.  She is currently monitored for the following issues for this high-risk pregnancy and has Elevated liver enzymes; Amenorrhea; Maternal morbid obesity, antepartum ([place]); Encounter for supervision of normal pregnancy; Rubella non-immune status, antepartum; and Abnormal prenatal test on their problem list. ? ?Patient reports no complaints.  Contractions: Not present. Vag. Bleeding: None.  Movement: Present. Denies leaking of fluid.  ? ?The following portions of the patient's history were reviewed and updated as appropriate: allergies, current medications, past family history, past medical history, past social history, past surgical history and problem list. Problem list updated. ? ?Objective:  ? ?Vitals:  ? 03/28/22 0835  ?BP: 121/79  ?Pulse: 99  ?Weight: 255 lb (115.7 kg)  ? ? ?Fetal Status: Fetal Heart Rate (bpm): 153 Fundal Height: 31 cm Movement: Present  Presentation: Undeterminable ? ?General:  Alert, oriented and cooperative. Patient is in no acute distress.  ?Skin: Skin is warm and dry. No rash noted.   ?Cardiovascular: Normal heart rate noted  ?Respiratory: Normal respiratory effort, no problems with respiration noted  ?Abdomen: Soft, gravid, appropriate for gestational age. Pain/Pressure: Absent     ?Pelvic: Vag. Bleeding: None Vag D/C Character: Thin   ?Cervical exam deferred        ?Extremities: Normal range of motion.  Edema: None  ?Mental Status: Normal mood and affect. Normal behavior. Normal judgment and thought content.  ? ?Urinalysis:     ? ?Assessment and Plan:  ?Pregnancy: G2P1001 at [redacted]w[redacted]d ? ?1. Elevated liver enzymes ?-Followed by GI, fatty liver, f/u in 2 mos ? ?2. Encounter for supervision of other normal pregnancy in second trimester ?- 2Hr GTT w/ 1 Hr [place] 75 g ?- HIV antibody (with reflex) ?- CBC ?- RPR ?- Tdap vaccine greater than or equal to 7yo IM ? ?3. Maternal morbid obesity,  antepartum ([place]) ?-Korea yesterday 69%ile ?-weekly AT at 34w ? ?4. [redacted] weeks gestation of pregnancy ? ?Preterm labor symptoms and general obstetric precautions including but not limited to vaginal bleeding, contractions, leaking of fluid and fetal movement were reviewed in detail with the patient. ?Please refer to After Visit Summary for other counseling recommendations.  ?Return in about 2 weeks (around 04/11/2022). ? ? ?Julianne Handler, CNM ? ?

## 2022-03-31 LAB — CBC
HCT: 34.4 % — ABNORMAL LOW (ref 35.0–45.0)
Hemoglobin: 11.3 g/dL — ABNORMAL LOW (ref 11.7–15.5)
MCH: 28.3 pg (ref 27.0–33.0)
MCHC: 32.8 g/dL (ref 32.0–36.0)
MCV: 86 fL (ref 80.0–100.0)
MPV: 10.4 fL (ref 7.5–12.5)
Platelets: 426 10*3/uL — ABNORMAL HIGH (ref 140–400)
RBC: 4 10*6/uL (ref 3.80–5.10)
RDW: 12.3 % (ref 11.0–15.0)
WBC: 11 10*3/uL — ABNORMAL HIGH (ref 3.8–10.8)

## 2022-03-31 LAB — 2HR GTT W 1 HR, CARPENTER, 75 G
Glucose, 1 Hr, Gest: 126 mg/dL (ref 65–179)
Glucose, 2 Hr, Gest: 109 mg/dL (ref 65–152)
Glucose, Fasting, Gest: 82 mg/dL (ref 65–91)

## 2022-03-31 LAB — RPR: RPR Ser Ql: NONREACTIVE

## 2022-03-31 LAB — HIV ANTIBODY (ROUTINE TESTING W REFLEX): HIV 1&2 Ab, 4th Generation: NONREACTIVE

## 2022-04-14 ENCOUNTER — Ambulatory Visit (INDEPENDENT_AMBULATORY_CARE_PROVIDER_SITE_OTHER): Payer: Commercial Managed Care - PPO | Admitting: Obstetrics & Gynecology

## 2022-04-14 VITALS — BP 122/80 | HR 98 | Wt 257.0 lb

## 2022-04-14 DIAGNOSIS — N949 Unspecified condition associated with female genital organs and menstrual cycle: Secondary | ICD-10-CM

## 2022-04-14 DIAGNOSIS — Z3490 Encounter for supervision of normal pregnancy, unspecified, unspecified trimester: Secondary | ICD-10-CM

## 2022-04-14 NOTE — Progress Notes (Signed)
C/O Lt stomach pain "stabbing" ? ? ? ?PRENATAL VISIT NOTE ? ?Subjective:  ?Jasmine Decker is a 26 y.o. G2P1001 at [redacted]w[redacted]d being seen today for ongoing prenatal care.  She is currently monitored for the following issues for this high-risk pregnancy and has Elevated liver enzymes; Amenorrhea; Maternal morbid obesity, antepartum ([place]); Encounter for supervision of normal pregnancy; Rubella non-immune status, antepartum; and Abnormal prenatal test on their problem list. ? ?Patient reports  stabbing left sided upper abdominal pain since 7:40 a.m.  It went away and came back.  No N/V/D.  No dysuria.  +FM.  No vaginal bleeding or LOF .  Contractions: Not present. Vag. Bleeding: None.  Movement: Present. Denies leaking of fluid.  ? ?The following portions of the patient's history were reviewed and updated as appropriate: allergies, current medications, past family history, past medical history, past social history, past surgical history and problem list.  ? ?Objective:  ? ?Vitals:  ? 04/14/22 0949  ?BP: 122/80  ?Pulse: 98  ?Weight: 257 lb (116.6 kg)  ? ? ?Fetal Status: Fetal Heart Rate (bpm): 141   Movement: Present    ? ?General:  Alert, oriented and cooperative. Patient is in no acute distress.  ?Skin: Skin is warm and dry. No rash noted.   ?Cardiovascular: Normal heart rate noted  ?Respiratory: Normal respiratory effort, no problems with respiration noted  ?Abdomen: Soft, gravid, appropriate for gestational age.  Pain/Pressure: Absent     ?Pelvic: Cervical exam performed in the presence of a chaperone     cl/long/tone/no presenting part   ?Extremities: Normal range of motion.  Edema: None  ?Mental Status: Normal mood and affect. Normal behavior. Normal judgment and thought content.  ? ?Assessment and Plan:  ?Pregnancy: G2P1001 at [redacted]w[redacted]d with intermittant left upper outer stabbing pain.  No evidence of PTL, constipation, UTI, pyelonephritis.  Probably RL pain.   ? ?Pt to monitor sympoms.  Discussed fundal tightening / pre  term contractions.  Pt not having this currently and will let us know if she does.   ? ?Pt to keep MFM appt on Thursday and RTC 2 week.  ? ?Preterm labor symptoms and general obstetric precautions including but not limited to vaginal bleeding, contractions, leaking of fluid and fetal movement were reviewed in detail with the patient. ?Please refer to After Visit Summary for other counseling recommendations.  ? ? ?Future Appointments  ?Date Time Provider [place]  ?04/17/2022 10:30 AM [facility] NURSE [facility]  ?04/17/2022 10:45 AM [facility] US6 [facility]  ?04/24/2022 10:30 AM [facility] NURSE [facility]  ?04/24/2022 10:45 AM [facility] US5 [facility]  ?05/01/2022 10:45 AM [facility] NURSE [facility]  ?05/01/2022 11:00 AM [facility] US1 [facility]  ?05/08/2022 12:30 PM [facility] NURSE [facility]  ?05/08/2022 12:45 PM [facility] US4 [facility]  ? ? ?Silas Sacramento, MD ? ?

## 2022-04-17 ENCOUNTER — Other Ambulatory Visit: Payer: Commercial Managed Care - PPO

## 2022-04-17 ENCOUNTER — Ambulatory Visit: Payer: Commercial Managed Care - PPO

## 2022-04-17 ENCOUNTER — Ambulatory Visit: Payer: Commercial Managed Care - PPO | Admitting: *Deleted

## 2022-04-17 ENCOUNTER — Ambulatory Visit: Payer: Commercial Managed Care - PPO | Attending: Maternal & Fetal Medicine

## 2022-04-17 ENCOUNTER — Encounter: Payer: Self-pay | Admitting: *Deleted

## 2022-04-17 VITALS — BP 132/70 | HR 93

## 2022-04-17 DIAGNOSIS — Z6841 Body Mass Index (BMI) 40.0 and over, adult: Secondary | ICD-10-CM | POA: Diagnosis present

## 2022-04-17 DIAGNOSIS — Z3A32 32 weeks gestation of pregnancy: Secondary | ICD-10-CM

## 2022-04-17 DIAGNOSIS — O285 Abnormal chromosomal and genetic finding on antenatal screening of mother: Secondary | ICD-10-CM | POA: Diagnosis not present

## 2022-04-17 DIAGNOSIS — E669 Obesity, unspecified: Secondary | ICD-10-CM

## 2022-04-17 DIAGNOSIS — O99213 Obesity complicating pregnancy, third trimester: Secondary | ICD-10-CM | POA: Diagnosis not present

## 2022-04-17 DIAGNOSIS — R748 Abnormal levels of other serum enzymes: Secondary | ICD-10-CM | POA: Insufficient documentation

## 2022-04-17 DIAGNOSIS — O280 Abnormal hematological finding on antenatal screening of mother: Secondary | ICD-10-CM | POA: Diagnosis not present

## 2022-04-24 ENCOUNTER — Other Ambulatory Visit: Payer: Self-pay | Admitting: *Deleted

## 2022-04-24 ENCOUNTER — Encounter: Payer: Self-pay | Admitting: *Deleted

## 2022-04-24 ENCOUNTER — Ambulatory Visit: Payer: Commercial Managed Care - PPO | Attending: Maternal & Fetal Medicine

## 2022-04-24 ENCOUNTER — Ambulatory Visit: Payer: Commercial Managed Care - PPO | Admitting: *Deleted

## 2022-04-24 VITALS — BP 122/68 | HR 91

## 2022-04-24 DIAGNOSIS — O99213 Obesity complicating pregnancy, third trimester: Secondary | ICD-10-CM | POA: Diagnosis not present

## 2022-04-24 DIAGNOSIS — R748 Abnormal levels of other serum enzymes: Secondary | ICD-10-CM

## 2022-04-24 DIAGNOSIS — Z3A33 33 weeks gestation of pregnancy: Secondary | ICD-10-CM

## 2022-04-24 DIAGNOSIS — O285 Abnormal chromosomal and genetic finding on antenatal screening of mother: Secondary | ICD-10-CM

## 2022-04-24 DIAGNOSIS — Z6841 Body Mass Index (BMI) 40.0 and over, adult: Secondary | ICD-10-CM | POA: Diagnosis present

## 2022-04-24 DIAGNOSIS — E669 Obesity, unspecified: Secondary | ICD-10-CM | POA: Diagnosis not present

## 2022-05-01 ENCOUNTER — Ambulatory Visit: Payer: Commercial Managed Care - PPO | Admitting: *Deleted

## 2022-05-01 ENCOUNTER — Encounter: Payer: Self-pay | Admitting: *Deleted

## 2022-05-01 ENCOUNTER — Ambulatory Visit: Payer: Commercial Managed Care - PPO

## 2022-05-01 ENCOUNTER — Ambulatory Visit: Payer: Commercial Managed Care - PPO | Attending: Obstetrics and Gynecology | Admitting: *Deleted

## 2022-05-01 ENCOUNTER — Other Ambulatory Visit: Payer: Self-pay

## 2022-05-01 VITALS — BP 118/59 | HR 89

## 2022-05-01 DIAGNOSIS — O99213 Obesity complicating pregnancy, third trimester: Secondary | ICD-10-CM | POA: Diagnosis present

## 2022-05-01 DIAGNOSIS — Z3A34 34 weeks gestation of pregnancy: Secondary | ICD-10-CM | POA: Diagnosis not present

## 2022-05-01 DIAGNOSIS — Z6841 Body Mass Index (BMI) 40.0 and over, adult: Secondary | ICD-10-CM

## 2022-05-01 NOTE — Procedures (Signed)
Jasmine Decker 09/15/1996 [redacted]w[redacted]d  Fetus A Non-Stress Test Interpretation for 05/01/22  Indication:  morbidly obese  Fetal Heart Rate A Mode: External Baseline Rate (A): 135 bpm Variability: Moderate Accelerations: 15 x 15 Decelerations: None Multiple birth?: No  Uterine Activity Mode: Toco Contraction Frequency (min): none Resting Tone Palpated: Relaxed  Interpretation (Fetal Testing) Nonstress Test Interpretation: Reactive Overall Impression: Reassuring for gestational age Comments: tracing reviewed by Dr. Parke Poisson

## 2022-05-06 ENCOUNTER — Ambulatory Visit (INDEPENDENT_AMBULATORY_CARE_PROVIDER_SITE_OTHER): Payer: Commercial Managed Care - PPO

## 2022-05-06 VITALS — BP 113/77 | HR 80 | Wt 261.0 lb

## 2022-05-06 DIAGNOSIS — Z3490 Encounter for supervision of normal pregnancy, unspecified, unspecified trimester: Secondary | ICD-10-CM

## 2022-05-06 DIAGNOSIS — R748 Abnormal levels of other serum enzymes: Secondary | ICD-10-CM

## 2022-05-06 DIAGNOSIS — Z3A34 34 weeks gestation of pregnancy: Secondary | ICD-10-CM

## 2022-05-06 DIAGNOSIS — O99213 Obesity complicating pregnancy, third trimester: Secondary | ICD-10-CM

## 2022-05-06 NOTE — Progress Notes (Signed)
   PRENATAL VISIT NOTE  Subjective:  Jasmine Decker is a 26 y.o. G2P1001 at [redacted]w[redacted]d being seen today for ongoing prenatal care.  She is currently monitored for the following issues for this high-risk pregnancy and has Elevated liver enzymes; Maternal morbid obesity, antepartum ([place]); Encounter for supervision of normal pregnancy; Rubella non-immune status, antepartum; and Abnormal prenatal test on their problem list.  Patient reports itching on fingers/hands, back and legs that started about 1 week ago. Patient reports itching only occurs while she is at work and resolves once she is at home. She works nights at [employer] at [employer]. She is not around any chemicals. She denies rashes, bumps, lesions. No one else at work has the itching or similar symptoms.  Contractions: Not present. Vag. Bleeding: None.  Movement: Present. Denies leaking of fluid.   The following portions of the patient's history were reviewed and updated as appropriate: allergies, current medications, past family history, past medical history, past social history, past surgical history and problem list.   Objective:   Vitals:   05/06/22 0906  BP: 113/77  Pulse: 80  Weight: 261 lb (118.4 kg)    Fetal Status: Fetal Heart Rate (bpm): 144   Movement: Present     General:  Alert, oriented and cooperative. Patient is in no acute distress.  Skin: Skin is warm and dry. No rash noted.   Cardiovascular: Normal heart rate noted  Respiratory: Normal respiratory effort, no problems with respiration noted  Abdomen: Soft, gravid, appropriate for gestational age.  Pain/Pressure: Absent     Pelvic: Cervical exam deferred        Extremities: Normal range of motion.  Edema: None  Mental Status: Normal mood and affect. Normal behavior. Normal judgment and thought content.   Assessment and Plan:  Pregnancy: G2P1001 at [redacted]w[redacted]d 1. Encounter for supervision of normal pregnancy, antepartum, unspecified gravidity - Routine OB.  -  Anticipatory guidance for upcoming appointments. GBS and GC/CT next visit - Itching only while at work. I'm suspicious for possible environmental trigger as symptoms resolve once she is at home, but will order bile acids to rule out cholestasis  - Bile acids, total - Comp Met (CMET)  2. [redacted] weeks gestation of pregnancy - Endorses active fetal movement  3. Maternal morbid obesity, antepartum ([place]) - Growth on 5/18 was 2413g, 82% - Scheduled for antenatal testing per MFM recommendations  4. Elevated liver enzymes - Followed by GI   Preterm labor symptoms and general obstetric precautions including but not limited to vaginal bleeding, contractions, leaking of fluid and fetal movement were reviewed in detail with the patient. Please refer to After Visit Summary for other counseling recommendations.   Return in about 2 weeks (around 05/20/2022).  Future Appointments  Date Time Provider [place]  05/08/2022 12:30 PM [facility] NURSE [facility]  05/08/2022 12:45 PM [facility] US4 [facility]  05/16/2022 10:15 AM [facility] NURSE [facility]  05/16/2022 10:30 AM [facility] US3 [facility]  05/19/2022  9:50 AM Gala Romney, Fredderick Phenix, MD [facility]  05/23/2022 12:45 PM [facility] NURSE [facility]  05/23/2022  1:00 PM [facility] US1 W[facility]-MFC[facility] [place], CNM

## 2022-05-06 NOTE — Progress Notes (Signed)
Pt c/o's itching all over.

## 2022-05-07 LAB — COMPREHENSIVE METABOLIC PANEL
AG Ratio: 1 (calc) (ref 1.0–2.5)
ALT: 34 U/L — ABNORMAL HIGH (ref 6–29)
AST: 65 U/L — ABNORMAL HIGH (ref 10–30)
Albumin: 3.5 g/dL — ABNORMAL LOW (ref 3.6–5.1)
Alkaline phosphatase (APISO): 120 U/L (ref 31–125)
BUN/Creatinine Ratio: 21 (calc) (ref 6–22)
BUN: 8 mg/dL (ref 7–25)
CO2: 17 mmol/L — ABNORMAL LOW (ref 20–32)
Calcium: 9.2 mg/dL (ref 8.6–10.2)
Chloride: 105 mmol/L (ref 98–110)
Creat: 0.38 mg/dL — ABNORMAL LOW (ref 0.50–0.96)
Globulin: 3.5 g/dL (calc) (ref 1.9–3.7)
Glucose, Bld: 84 mg/dL (ref 65–139)
Potassium: 4.6 mmol/L (ref 3.5–5.3)
Sodium: 135 mmol/L (ref 135–146)
Total Bilirubin: 0.7 mg/dL (ref 0.2–1.2)
Total Protein: 7 g/dL (ref 6.1–8.1)

## 2022-05-08 ENCOUNTER — Ambulatory Visit: Payer: Commercial Managed Care - PPO | Attending: Maternal & Fetal Medicine

## 2022-05-08 ENCOUNTER — Ambulatory Visit: Payer: Commercial Managed Care - PPO | Admitting: *Deleted

## 2022-05-08 ENCOUNTER — Encounter: Payer: Self-pay | Admitting: *Deleted

## 2022-05-08 VITALS — BP 123/57 | HR 83

## 2022-05-08 DIAGNOSIS — Z6841 Body Mass Index (BMI) 40.0 and over, adult: Secondary | ICD-10-CM | POA: Diagnosis present

## 2022-05-08 DIAGNOSIS — O99213 Obesity complicating pregnancy, third trimester: Secondary | ICD-10-CM

## 2022-05-08 DIAGNOSIS — O285 Abnormal chromosomal and genetic finding on antenatal screening of mother: Secondary | ICD-10-CM | POA: Diagnosis not present

## 2022-05-08 DIAGNOSIS — O288 Other abnormal findings on antenatal screening of mother: Secondary | ICD-10-CM

## 2022-05-08 DIAGNOSIS — E669 Obesity, unspecified: Secondary | ICD-10-CM

## 2022-05-08 DIAGNOSIS — R748 Abnormal levels of other serum enzymes: Secondary | ICD-10-CM

## 2022-05-08 DIAGNOSIS — Z3A35 35 weeks gestation of pregnancy: Secondary | ICD-10-CM

## 2022-05-08 LAB — BILE ACIDS, TOTAL: BILE ACIDS, TOTAL: 6 umol/L (ref 0–10)

## 2022-05-09 ENCOUNTER — Other Ambulatory Visit: Payer: Self-pay | Admitting: *Deleted

## 2022-05-09 DIAGNOSIS — O99213 Obesity complicating pregnancy, third trimester: Secondary | ICD-10-CM

## 2022-05-16 ENCOUNTER — Encounter: Payer: Self-pay | Admitting: *Deleted

## 2022-05-16 ENCOUNTER — Ambulatory Visit: Payer: Commercial Managed Care - PPO | Attending: Maternal & Fetal Medicine

## 2022-05-16 ENCOUNTER — Ambulatory Visit: Payer: Commercial Managed Care - PPO | Admitting: *Deleted

## 2022-05-16 VITALS — BP 120/61 | HR 92

## 2022-05-16 DIAGNOSIS — O285 Abnormal chromosomal and genetic finding on antenatal screening of mother: Secondary | ICD-10-CM | POA: Diagnosis not present

## 2022-05-16 DIAGNOSIS — E669 Obesity, unspecified: Secondary | ICD-10-CM | POA: Diagnosis not present

## 2022-05-16 DIAGNOSIS — Z6841 Body Mass Index (BMI) 40.0 and over, adult: Secondary | ICD-10-CM

## 2022-05-16 DIAGNOSIS — Z3A36 36 weeks gestation of pregnancy: Secondary | ICD-10-CM | POA: Diagnosis not present

## 2022-05-16 DIAGNOSIS — O99213 Obesity complicating pregnancy, third trimester: Secondary | ICD-10-CM

## 2022-05-19 ENCOUNTER — Encounter: Payer: Self-pay | Admitting: Obstetrics & Gynecology

## 2022-05-19 ENCOUNTER — Ambulatory Visit (INDEPENDENT_AMBULATORY_CARE_PROVIDER_SITE_OTHER): Payer: Commercial Managed Care - PPO | Admitting: Obstetrics & Gynecology

## 2022-05-19 ENCOUNTER — Other Ambulatory Visit (HOSPITAL_COMMUNITY)
Admission: RE | Admit: 2022-05-19 | Discharge: 2022-05-19 | Disposition: A | Discharge status: Home / Self Care | Payer: Commercial Managed Care - PPO | Source: Ambulatory Visit | Attending: Obstetrics & Gynecology | Admitting: Obstetrics & Gynecology

## 2022-05-19 VITALS — BP 117/78 | HR 80 | Wt 263.0 lb

## 2022-05-19 DIAGNOSIS — Z3482 Encounter for supervision of other normal pregnancy, second trimester: Secondary | ICD-10-CM | POA: Diagnosis not present

## 2022-05-19 NOTE — Progress Notes (Addendum)
   PRENATAL VISIT NOTE  Subjective:  Jasmine Decker is a 26 y.o. G2P1001 at [redacted]w[redacted]d being seen today for ongoing prenatal care.  She is currently monitored for the following issues for this low-risk pregnancy and has Elevated liver enzymes; Maternal morbid obesity, antepartum (HCC); Encounter for supervision of normal pregnancy; Rubella non-immune status, antepartum; and Abnormal prenatal test on their problem list.  Patient reports no complaints.  Contractions: Not present. Vag. Bleeding: None.  Movement: Present. Denies leaking of fluid.   The following portions of the patient's history were reviewed and updated as appropriate: allergies, current medications, past family history, past medical history, past social history, past surgical history and problem list.   Objective:   Vitals:   05/19/22 0955  BP: 117/78  Pulse: 80  Weight: 263 lb (119.3 kg)    Fetal Status:     Movement: Present     General:  Alert, oriented and cooperative. Patient is in no acute distress.  Skin: Skin is warm and dry. No rash noted.   Cardiovascular: Normal heart rate noted  Respiratory: Normal respiratory effort, no problems with respiration noted  Abdomen: Soft, gravid, appropriate for gestational age.  Pain/Pressure: Absent     Pelvic: Cervical exam performed in the presence of a chaperone      FT/Ballotable  Extremities: Normal range of motion.  Edema: None  Mental Status: Normal mood and affect. Normal behavior. Normal judgment and thought content.   Assessment and Plan:  Pregnancy: G2P1001 at [redacted]w[redacted]d 1. Encounter for supervision of other normal pregnancy in second trimester -Continue weekly BPP for maternal obesity.  Growth q4.  Induce 39-10 weeks per protocol.  - Culture, beta strep (group b only) - Cervicovaginal ancillary only( [place])  Term labor symptoms and general obstetric precautions including but not limited to vaginal bleeding, contractions, leaking of fluid and fetal movement were  reviewed in detail with the patient. Please refer to After Visit Summary for other counseling recommendations.   No follow-ups on file.  Future Appointments  Date Time Provider Department Center  05/23/2022 12:45 PM [facility] NURSE [facility]  05/23/2022  1:00 PM [facility] US1 [facility]  05/28/2022  7:15 AM [facility] NURSE [facility]  05/28/2022  7:30 AM [facility] US3 [facility]    Elsie Lincoln, MD

## 2022-05-20 LAB — CERVICOVAGINAL ANCILLARY ONLY
Chlamydia: NEGATIVE
Comment: NEGATIVE
Comment: NORMAL
Neisseria Gonorrhea: NEGATIVE

## 2022-05-22 LAB — CULTURE, BETA STREP (GROUP B ONLY)
MICRO NUMBER:: 13512959
SPECIMEN QUALITY:: ADEQUATE

## 2022-05-22 LAB — OB RESULTS CONSOLE GBS: GBS: NEGATIVE

## 2022-05-23 ENCOUNTER — Encounter: Payer: Self-pay | Admitting: *Deleted

## 2022-05-23 ENCOUNTER — Ambulatory Visit: Payer: Commercial Managed Care - PPO | Admitting: *Deleted

## 2022-05-23 ENCOUNTER — Other Ambulatory Visit: Payer: Self-pay | Admitting: Maternal & Fetal Medicine

## 2022-05-23 ENCOUNTER — Ambulatory Visit (HOSPITAL_BASED_OUTPATIENT_CLINIC_OR_DEPARTMENT_OTHER): Payer: Commercial Managed Care - PPO | Admitting: *Deleted

## 2022-05-23 ENCOUNTER — Ambulatory Visit: Payer: Commercial Managed Care - PPO | Attending: Maternal & Fetal Medicine

## 2022-05-23 VITALS — BP 116/68 | HR 88

## 2022-05-23 DIAGNOSIS — Z3A37 37 weeks gestation of pregnancy: Secondary | ICD-10-CM

## 2022-05-23 DIAGNOSIS — O99213 Obesity complicating pregnancy, third trimester: Secondary | ICD-10-CM | POA: Diagnosis not present

## 2022-05-23 DIAGNOSIS — O289 Unspecified abnormal findings on antenatal screening of mother: Secondary | ICD-10-CM

## 2022-05-23 DIAGNOSIS — O2693 Pregnancy related conditions, unspecified, third trimester: Secondary | ICD-10-CM | POA: Diagnosis not present

## 2022-05-23 DIAGNOSIS — O285 Abnormal chromosomal and genetic finding on antenatal screening of mother: Secondary | ICD-10-CM | POA: Diagnosis not present

## 2022-05-23 DIAGNOSIS — O283 Abnormal ultrasonic finding on antenatal screening of mother: Secondary | ICD-10-CM | POA: Insufficient documentation

## 2022-05-23 DIAGNOSIS — E669 Obesity, unspecified: Secondary | ICD-10-CM

## 2022-05-23 DIAGNOSIS — Z3482 Encounter for supervision of other normal pregnancy, second trimester: Secondary | ICD-10-CM

## 2022-05-23 NOTE — Procedures (Cosign Needed)
Jasmine Decker Apr 24, 1996 [redacted]w[redacted]d  Fetus A Non-Stress Test Interpretation for 05/23/22  Indication: Unsatisfactory BPP  Fetal Heart Rate A Mode: External Baseline Rate (A): 130 bpm Variability: Moderate Accelerations: 15 x 15 Decelerations: None Multiple birth?: No  Uterine Activity Mode: Palpation, Toco Contraction Frequency (min): none Resting Tone Palpated: Relaxed  Interpretation (Fetal Testing) Nonstress Test Interpretation: Reactive Overall Impression: Reassuring for gestational age Comments: Dr. Grace Bushy reviewed tracing

## 2022-05-26 ENCOUNTER — Ambulatory Visit (INDEPENDENT_AMBULATORY_CARE_PROVIDER_SITE_OTHER): Payer: Commercial Managed Care - PPO | Admitting: Obstetrics & Gynecology

## 2022-05-26 VITALS — BP 118/79 | HR 87 | Wt 264.0 lb

## 2022-05-26 DIAGNOSIS — Z3A37 37 weeks gestation of pregnancy: Secondary | ICD-10-CM

## 2022-05-26 DIAGNOSIS — Z3483 Encounter for supervision of other normal pregnancy, third trimester: Secondary | ICD-10-CM

## 2022-05-26 DIAGNOSIS — O99210 Obesity complicating pregnancy, unspecified trimester: Secondary | ICD-10-CM

## 2022-05-26 NOTE — Progress Notes (Unsigned)
   PRENATAL VISIT NOTE  Subjective:  Jasmine Decker is a 26 y.o. G2P1001 at [redacted]w[redacted]d being seen today for ongoing prenatal care.  She is currently monitored for the following issues for this high-risk pregnancy and has Elevated liver enzymes; Maternal morbid obesity, antepartum (HCC); Encounter for supervision of normal pregnancy; Rubella non-immune status, antepartum; and Abnormal prenatal test on their problem list.  Patient reports no complaints.  Contractions: Not present. Vag. Bleeding: None.  Movement: Present. Denies leaking of fluid.   The following portions of the patient's history were reviewed and updated as appropriate: allergies, current medications, past family history, past medical history, past social history, past surgical history and problem list.   Objective:   Vitals:   05/26/22 1500  BP: 118/79  Pulse: 87  Weight: 264 lb (119.7 kg)    Fetal Status: Fetal Heart Rate (bpm): 138   Movement: Present     General:  Alert, oriented and cooperative. Patient is in no acute distress.  Skin: Skin is warm and dry. No rash noted.   Cardiovascular: Normal heart rate noted  Respiratory: Normal respiratory effort, no problems with respiration noted  Abdomen: Soft, gravid, appropriate for gestational age.  Pain/Pressure: Present     Pelvic: Cervical exam performed in the presence of a chaperone        Extremities: Normal range of motion.     Mental Status: Normal mood and affect. Normal behavior. Normal judgment and thought content.   Assessment and Plan:  Pregnancy: G2P1001 at [redacted]w[redacted]d 1. Maternal morbid obesity, antepartum (HCC) Continue antenatal testing; induction scheduled for 39 weeks (MFM last scheduled appt is this week.  2. Encounter for supervision of other normal pregnancy in third trimester  Term labor symptoms and general obstetric precautions including but not limited to vaginal bleeding, contractions, leaking of fluid and fetal movement were reviewed in detail with  the patient. Please refer to After Visit Summary for other counseling recommendations.   No follow-ups on file.  Future Appointments  Date Time Provider Department Center  05/28/2022  7:15 AM [facility] NURSE [facility]  05/28/2022  7:30 AM [facility] US3 [facility]  06/02/2022  2:50 PM Chibuikem Thang, Fredrich Romans, MD [facility]    Elsie Lincoln, MD

## 2022-05-27 ENCOUNTER — Encounter (HOSPITAL_COMMUNITY): Payer: Self-pay | Admitting: *Deleted

## 2022-05-27 ENCOUNTER — Telehealth (HOSPITAL_COMMUNITY): Payer: Self-pay | Admitting: *Deleted

## 2022-05-27 ENCOUNTER — Encounter (HOSPITAL_COMMUNITY): Payer: Self-pay

## 2022-05-27 NOTE — Telephone Encounter (Signed)
Preadmission screen  

## 2022-05-28 ENCOUNTER — Encounter: Payer: Self-pay | Admitting: *Deleted

## 2022-05-28 ENCOUNTER — Ambulatory Visit: Payer: Commercial Managed Care - PPO | Admitting: *Deleted

## 2022-05-28 ENCOUNTER — Ambulatory Visit: Payer: Commercial Managed Care - PPO | Attending: Obstetrics and Gynecology

## 2022-05-28 VITALS — BP 120/61 | HR 79

## 2022-05-28 DIAGNOSIS — O99213 Obesity complicating pregnancy, third trimester: Secondary | ICD-10-CM

## 2022-05-28 DIAGNOSIS — O2693 Pregnancy related conditions, unspecified, third trimester: Secondary | ICD-10-CM | POA: Insufficient documentation

## 2022-05-28 DIAGNOSIS — O285 Abnormal chromosomal and genetic finding on antenatal screening of mother: Secondary | ICD-10-CM | POA: Insufficient documentation

## 2022-05-28 DIAGNOSIS — Z3A37 37 weeks gestation of pregnancy: Secondary | ICD-10-CM

## 2022-05-28 DIAGNOSIS — Z3483 Encounter for supervision of other normal pregnancy, third trimester: Secondary | ICD-10-CM | POA: Insufficient documentation

## 2022-05-28 DIAGNOSIS — O289 Unspecified abnormal findings on antenatal screening of mother: Secondary | ICD-10-CM | POA: Insufficient documentation

## 2022-05-28 DIAGNOSIS — E669 Obesity, unspecified: Secondary | ICD-10-CM

## 2022-06-02 ENCOUNTER — Other Ambulatory Visit: Payer: Self-pay | Admitting: Obstetrics & Gynecology

## 2022-06-02 ENCOUNTER — Ambulatory Visit (INDEPENDENT_AMBULATORY_CARE_PROVIDER_SITE_OTHER): Payer: Commercial Managed Care - PPO | Admitting: Obstetrics & Gynecology

## 2022-06-02 DIAGNOSIS — O289 Unspecified abnormal findings on antenatal screening of mother: Secondary | ICD-10-CM

## 2022-06-02 DIAGNOSIS — Z3483 Encounter for supervision of other normal pregnancy, third trimester: Secondary | ICD-10-CM

## 2022-06-02 DIAGNOSIS — O99210 Obesity complicating pregnancy, unspecified trimester: Secondary | ICD-10-CM

## 2022-06-04 ENCOUNTER — Other Ambulatory Visit: Payer: Self-pay

## 2022-06-04 ENCOUNTER — Inpatient Hospital Stay (HOSPITAL_COMMUNITY)
Admission: AD | Admit: 2022-06-04 | Payer: Commercial Managed Care - PPO | Source: Home / Self Care | Admitting: Family Medicine

## 2022-06-04 ENCOUNTER — Inpatient Hospital Stay (HOSPITAL_COMMUNITY): Payer: Commercial Managed Care - PPO

## 2022-06-04 ENCOUNTER — Other Ambulatory Visit: Payer: Self-pay | Admitting: Advanced Practice Midwife

## 2022-06-05 ENCOUNTER — Inpatient Hospital Stay (HOSPITAL_COMMUNITY): Payer: Commercial Managed Care - PPO | Admitting: Anesthesiology

## 2022-06-05 ENCOUNTER — Other Ambulatory Visit: Payer: Self-pay

## 2022-06-05 ENCOUNTER — Inpatient Hospital Stay (HOSPITAL_COMMUNITY): Payer: Commercial Managed Care - PPO

## 2022-06-05 ENCOUNTER — Inpatient Hospital Stay (HOSPITAL_COMMUNITY)
Admission: AD | Admit: 2022-06-05 | Discharge: 2022-06-07 | DRG: 807 | Disposition: A | Discharge status: Home / Self Care | Payer: Commercial Managed Care - PPO | Attending: Obstetrics and Gynecology | Admitting: Obstetrics and Gynecology

## 2022-06-05 ENCOUNTER — Encounter (HOSPITAL_COMMUNITY): Payer: Self-pay | Admitting: Obstetrics & Gynecology

## 2022-06-05 DIAGNOSIS — Z2839 Other underimmunization status: Secondary | ICD-10-CM

## 2022-06-05 DIAGNOSIS — O289 Unspecified abnormal findings on antenatal screening of mother: Secondary | ICD-10-CM | POA: Diagnosis present

## 2022-06-05 DIAGNOSIS — O99210 Obesity complicating pregnancy, unspecified trimester: Secondary | ICD-10-CM | POA: Diagnosis present

## 2022-06-05 DIAGNOSIS — Z23 Encounter for immunization: Secondary | ICD-10-CM

## 2022-06-05 DIAGNOSIS — Z3490 Encounter for supervision of normal pregnancy, unspecified, unspecified trimester: Secondary | ICD-10-CM

## 2022-06-05 DIAGNOSIS — O09899 Supervision of other high risk pregnancies, unspecified trimester: Secondary | ICD-10-CM

## 2022-06-05 DIAGNOSIS — O99214 Obesity complicating childbirth: Secondary | ICD-10-CM | POA: Diagnosis present

## 2022-06-05 DIAGNOSIS — O26893 Other specified pregnancy related conditions, third trimester: Secondary | ICD-10-CM | POA: Diagnosis present

## 2022-06-05 DIAGNOSIS — Z3A39 39 weeks gestation of pregnancy: Secondary | ICD-10-CM | POA: Diagnosis not present

## 2022-06-05 LAB — TYPE AND SCREEN
ABO/RH(D): A POS
Antibody Screen: NEGATIVE

## 2022-06-05 LAB — RPR: RPR Ser Ql: NONREACTIVE

## 2022-06-05 LAB — CBC
HCT: 33.3 % — ABNORMAL LOW (ref 36.0–46.0)
Hemoglobin: 10.5 g/dL — ABNORMAL LOW (ref 12.0–15.0)
MCH: 26.4 pg (ref 26.0–34.0)
MCHC: 31.5 g/dL (ref 30.0–36.0)
MCV: 83.7 fL (ref 80.0–100.0)
Platelets: 453 10*3/uL — ABNORMAL HIGH (ref 150–400)
RBC: 3.98 MIL/uL (ref 3.87–5.11)
RDW: 13.8 % (ref 11.5–15.5)
WBC: 11.2 10*3/uL — ABNORMAL HIGH (ref 4.0–10.5)
nRBC: 0 % (ref 0.0–0.2)

## 2022-06-05 MED ORDER — LACTATED RINGERS IV SOLN: 500.0000 mL | Freq: Once | INTRAVENOUS | Status: DC

## 2022-06-05 MED ORDER — MISOPROSTOL 50MCG HALF TABLET
50.0000 ug | ORAL_TABLET | ORAL | Status: DC | PRN
  Administered 2022-06-05: 50 ug via BUCCAL
  Filled 2022-06-05: qty 1

## 2022-06-05 MED ORDER — TERBUTALINE SULFATE 1 MG/ML IJ SOLN
0.2500 mg | Freq: Once | INTRAMUSCULAR | Status: DC | PRN
Start: 2022-06-05 — End: 2022-06-06

## 2022-06-05 MED ORDER — EPHEDRINE 5 MG/ML INJ: 10.0000 mg | INTRAVENOUS | Status: DC | PRN

## 2022-06-05 MED ORDER — DIPHENHYDRAMINE HCL 50 MG/ML IJ SOLN: 12.5000 mg | INTRAMUSCULAR | Status: DC | PRN

## 2022-06-05 MED ORDER — SOD CITRATE-CITRIC ACID 500-334 MG/5ML PO SOLN: 30.0000 mL | ORAL | Status: DC | PRN

## 2022-06-05 MED ORDER — LACTATED RINGERS IV SOLN: 500.0000 mL | INTRAVENOUS | Status: DC | PRN

## 2022-06-05 MED ORDER — ACETAMINOPHEN 325 MG PO TABS
650.0000 mg | ORAL_TABLET | ORAL | Status: DC | PRN
Start: 2022-06-05 — End: 2022-06-06
  Administered 2022-06-05: 650 mg via ORAL
  Filled 2022-06-05: qty 2

## 2022-06-05 MED ORDER — ONDANSETRON HCL 4 MG/2ML IJ SOLN
4.0000 mg | Freq: Four times a day (QID) | INTRAMUSCULAR | Status: DC | PRN
  Administered 2022-06-05: 4 mg via INTRAVENOUS
  Filled 2022-06-05: qty 2

## 2022-06-05 MED ORDER — LIDOCAINE HCL (PF) 1 % IJ SOLN: 30.0000 mL | INTRAMUSCULAR | Status: DC | PRN

## 2022-06-05 MED ORDER — FENTANYL-BUPIVACAINE-NACL 0.5-0.125-0.9 MG/250ML-% EP SOLN
12.0000 mL/h | EPIDURAL | Status: DC | PRN
  Administered 2022-06-05: 12 mL/h via EPIDURAL
  Filled 2022-06-05: qty 250

## 2022-06-05 MED ORDER — MISOPROSTOL 50MCG HALF TABLET
50.0000 ug | ORAL_TABLET | Freq: Once | ORAL | Status: AC
  Administered 2022-06-05: 50 ug via ORAL
  Filled 2022-06-05: qty 1

## 2022-06-05 MED ORDER — OXYCODONE-ACETAMINOPHEN 5-325 MG PO TABS: 2.0000 | ORAL_TABLET | ORAL | Status: DC | PRN

## 2022-06-05 MED ORDER — OXYTOCIN-SODIUM CHLORIDE 30-0.9 UT/500ML-% IV SOLN
2.5000 [IU]/h | INTRAVENOUS | Status: DC
  Filled 2022-06-05: qty 500

## 2022-06-05 MED ORDER — PHENYLEPHRINE 80 MCG/ML (10ML) SYRINGE FOR IV PUSH (FOR BLOOD PRESSURE SUPPORT): 80.0000 ug | PREFILLED_SYRINGE | INTRAVENOUS | Status: DC | PRN

## 2022-06-05 MED ORDER — LIDOCAINE HCL (PF) 1 % IJ SOLN
INTRAMUSCULAR | Status: DC | PRN
  Administered 2022-06-05: 11 mL via EPIDURAL

## 2022-06-05 MED ORDER — OXYTOCIN BOLUS FROM INFUSION
333.0000 mL | Freq: Once | INTRAVENOUS | Status: AC
  Administered 2022-06-06: 333 mL via INTRAVENOUS

## 2022-06-05 MED ORDER — OXYTOCIN-SODIUM CHLORIDE 30-0.9 UT/500ML-% IV SOLN
1.0000 m[IU]/min | INTRAVENOUS | Status: DC
  Administered 2022-06-05: 2 m[IU]/min via INTRAVENOUS

## 2022-06-05 MED ORDER — OXYCODONE-ACETAMINOPHEN 5-325 MG PO TABS: 1.0000 | ORAL_TABLET | ORAL | Status: DC | PRN

## 2022-06-05 MED ORDER — FLEET ENEMA 7-19 GM/118ML RE ENEM: 1.0000 | ENEMA | RECTAL | Status: DC | PRN

## 2022-06-05 MED ORDER — LACTATED RINGERS IV SOLN: INTRAVENOUS | Status: DC

## 2022-06-05 NOTE — Plan of Care (Signed)

## 2022-06-05 NOTE — Anesthesia Procedure Notes (Signed)
Epidural Patient location during procedure: OB Start time: 06/05/2022 7:13 PM End time: 06/05/2022 7:30 PM  Staffing Anesthesiologist: Lowella Curb, MD Performed: anesthesiologist   Preanesthetic Checklist Completed: patient identified, IV checked, site marked, risks and benefits discussed, surgical consent, monitors and equipment checked, pre-op evaluation and timeout performed  Epidural Patient position: sitting Prep: ChloraPrep Patient monitoring: heart rate, cardiac monitor, continuous pulse ox and blood pressure Approach: midline Location: L2-L3 Injection technique: LOR saline  Needle:  Needle type: Tuohy  Needle gauge: 17 G Needle length: 9 cm Needle insertion depth: 7 cm Catheter type: closed end flexible Catheter size: 20 Guage Catheter at skin depth: 12 cm Test dose: negative  Assessment Events: blood not aspirated, injection not painful, no injection resistance, no paresthesia and negative IV test  Additional Notes Reason for block:procedure for pain

## 2022-06-05 NOTE — Progress Notes (Signed)
Patient ID: Minahil Quinlivan, female   DOB: 25-Sep-1996, 26 y.o.   MRN: [id number]  S/p cytotec x 2 doses; feeling a little crampy but overall okay  BPs 123/78, 108/57 FHR 135, +accels, no decels Ctx irreg Cx was 0/50/vtx ballot @ 0545  IUP@39 .0wks ^BMI Unfavorable cervix  Continue with cervical ripening process, cytotec until able to place cervical foley  Arabella Merles [facility] 06/05/2022 8:13 AM

## 2022-06-05 NOTE — Progress Notes (Signed)
Labor Progress Note Jasmine Decker is a 26 y.o. G2P1001 at [redacted]w[redacted]d presented for IOL for BMI of 10 S:  Patient doing well. Feeling some cramping.    O:  BP 124/72   Pulse 78   Temp 98.1 F (36.7 C) (Oral)   Resp 18   Ht 5\' 3"  (1.6 m)   Wt 120.4 kg   LMP 09/05/2021   BMI 47.01 kg/m  EFM: 140/moderate/+accels/no decels  CVE: Dilation: 1 Effacement (%): 50 Station: -3 Presentation: Vertex Exam by:: [email or web address]   A&P: 26 y.o. G2P1001 at [redacted]w[redacted]d presented for IOL for BMI of 47 #Labor: S/p Cytotec x2. Cervix now a tight 1 cm. Patient ok with balloon placement now. Attempted cooks without stylet with speculum however when checked manually balloon not in cervix. Then attempted cooks with stylet manually and able to be placed without much difficulty. 60CC placed in uterine balloon. Will also given another cytotec at this time. #Pain: plans nitrous #FWB: Cat I #GBS negative  [redacted]w[redacted]d, MD, MPH OB Fellow, Faculty Practice

## 2022-06-05 NOTE — H&P (Signed)
Obstetric History and Physical  Jasmine Decker is a 26 y.o. G2P1001 with IUP at [redacted]w[redacted]d presenting for induction of labor. History remarkable for morbid obesity. Patient states she has been having irregular contractions, no vaginal bleeding, no leaking of fluid with active fetal movement.    Prenatal Course Source of Care: [facility]  with onset of care at 16 weeks Pregnancy complications or risks: Patient Active Problem List   Diagnosis Date Noted   Abnormal prenatal test 01/30/2022   Rubella non-immune status, antepartum 12/30/2021   Encounter for supervision of normal pregnancy 12/24/2021   Maternal morbid obesity, antepartum (HCC) 02/28/2021   Elevated liver enzymes 10/15/2013    Recent Ultrasounds:  05/23/2022 [redacted]w[redacted]d EFW 3344g/77%, AC 87%, normal AFV, cephalic, anterior placenta 05/28/2022 [redacted]w[redacted]d  BPP 8/8, normal AFV, cephalic  Nursing Staff Provider  Office Location  [facility] Dating  Bedside U/S  Language  English Anatomy US  Normal  Flu Vaccine  Declined Genetic/Carrier Screen  NIPS: insufficient FF AFP:  neg  Horizon:neg x 4  (partner is AS)  TDaP Vaccine   done 03/28/22 Hgb A1C or  GTT Early A1C 5.3 Third trimester nml  COVID Vaccine Done   LAB RESULTS   Rhogam  NA Blood Type A/RH(D) POSITIVE/-- (01/19 1103)   Baby Feeding Plan Breast Antibody NO ANTIBODIES DETECTED (01/19 1103)  Contraception None Rubella <0.90 (01/19 1103)  Circumcision Girl  RPR NON-REACTIVE (01/19 1103)   Pediatrician  [facility] [place] HBsAg NON-REACTIVE  Support Person FOB [place] HCVAb NON-REACTIVE   Prenatal Classes No HIV NON-REACTIVE     GBS Negative/-- (06/15 0000)    Pap 6/22 LGSIL HPV, needs repeat postpartum   Past Medical History:  Diagnosis Date   Gall stones    Morbid obesity (HCC)     Past Surgical History:  Procedure Laterality Date   CHOLECYSTECTOMY N/A 01/13/2015   Procedure: LAPAROSCOPIC CHOLECYSTECTOMY WITH POSSIBLE INTRAOPERATIVE CHOLANGIOGRAM;   Surgeon: Abigail Miyamoto, MD;  Location: [facility] OR;  Service: General;  Laterality: N/A;    OB History  Gravida Para Term Preterm AB Living  2 1 1     1   SAB IAB Ectopic Multiple Live Births        0 1    # Outcome Date GA Lbr Len/2nd Weight Sex Delivery Anes PTL Lv  2 Current           1 Term 02/14/17 [redacted]w[redacted]d 60:39 / 02:16 3447 g F Vag-Spont EPI  LIV     Birth Comments: WNL    Social History   Socioeconomic History   Marital status: Married    Spouse name: Not on file   Number of children: Not on file   Years of education: Not on file   Highest education level: Not on file  Occupational History   Not on file  Tobacco Use   Smoking status: Never    Passive exposure: Yes   Smokeless tobacco: Never   Tobacco comments:    Brother does smoke around the home, patient denies smoking/drugs/alcohol  Vaping Use   Vaping Use: Never used  Substance and Sexual Activity   Alcohol use: Never   Drug use: Never   Sexual activity: Yes    Birth control/protection: None  Other Topics Concern   Not on file  Social History Narrative   Lives at home with 66 year old brother, mother, no pets.  Patient's only hospital visit includes a trip to the ED for decreased po intake and swelling  in her throat, no admission.  This was at 26 years of age.   Social Determinants of Health   Financial Resource Strain: Not on file  Food Insecurity: No Food Insecurity (02/12/2022)   Hunger Vital Sign    Worried About Running Out of Food in the Last Year: Never true    Ran Out of Food in the Last Year: Never true  Transportation Needs: Not on file  Physical Activity: Not on file  Stress: Not on file  Social Connections: Not on file    Family History  Problem Relation Age of Onset   Gallbladder disease Maternal Aunt        3 aunts have had gallbladder removal   Liver disease Neg Hx     Medications Prior to Admission  Medication Sig Dispense Refill Last Dose   aspirin EC 81 MG tablet Take 1 tablet (81  mg total) by mouth daily. Swallow whole. 30 tablet 11    Prenatal Vit-Fe Fumarate-FA (MULTIVITAMIN-PRENATAL) 27-0.8 MG TABS tablet Take 1 tablet by mouth daily at 12 noon.       No Known Allergies  Review of Systems: Negative except for what is mentioned in HPI.  Physical Exam: BP 124/74   Pulse 98   Temp 98.4 F (36.9 C) (Oral)   Resp 18   Ht 5\' 3"  (1.6 m)   Wt 120.4 kg   LMP 09/05/2021   BMI 47.01 kg/m  CONSTITUTIONAL: Well-developed, well-nourished female in no acute distress.  HENT:  Normocephalic, atraumatic, External right and left ear normal. Oropharynx is clear and moist EYES: Conjunctivae and EOM are normal. Pupils are equal, round, and reactive to light. No scleral icterus.  NECK: Normal range of motion, supple, no masses SKIN: Skin is warm and dry. No rash noted. Not diaphoretic. No erythema. No pallor. NEUROLOGIC: Alert and oriented to person, place, and time. Normal reflexes, muscle tone coordination. No cranial nerve deficit noted. PSYCHIATRIC: Normal mood and affect. Normal behavior. Normal judgment and thought content. CARDIOVASCULAR: Normal heart rate noted, regular rhythm RESPIRATORY: Effort and breath sounds normal, no problems with respiration noted ABDOMEN: Soft, obese, nontender, nondistended, gravid. MUSCULOSKELETAL: Normal range of motion. No edema and no tenderness. 2+ distal pulses.  Cervical Exam: Dilatation 0 cm   Effacement 50%   Station ballotable   Presentation: cephalic (verified on bedside scan) FHT:  Baseline rate 140 bpm   Variability moderate  Accelerations present   Decelerations none Contractions: None   Pertinent Labs/Studies:   No results found for this or any previous visit (from the past 24 hour(s)).  Assessment : Jasmine Decker is a 26 y.o. G2P1001 at [redacted]w[redacted]d being admitted for induction of labor, history remarkable for maternal morbid obesity.   Plan: Labor: Induction ordered as per protocol with misoprostol and pitocin. Will AROM  later.  Analgesia as needed. FWB: Reassuring fetal heart tracing.  GBS negative Delivery plan: Hopeful for vaginal delivery   [redacted]w[redacted]d, MD, FACOG Obstetrician & Gynecologist, [facility] for [facility]

## 2022-06-05 NOTE — Progress Notes (Signed)
Labor Progress Note Riva Groseclose is a 26 y.o. G2P1001 at [redacted]w[redacted]d who presented for IOL due to BMI 47.   S: Doing well. Had some nausea/vomiting. Wondering if this is normal. Received Zofran. Reports mild headache. No vision changes or RUQ pain. She has no other concerns at this time.   O:  BP 105/60   Pulse (!) 102   Temp 98 F (36.7 C) (Oral)   Resp 18   Ht 5\' 3"  (1.6 m)   Wt 120.4 kg   LMP 09/05/2021   SpO2 99%   BMI 47.01 kg/m   EFM: Baseline 125 bpm, moderate variability, + accels, no decels  Toco: Every 1.5-4 minutes, MVUs inadequate   CVE: Dilation: 6 Effacement (%): 70 Cervical Position: Middle Station: -2 Presentation: Vertex Exam by:: [email or web address] RN   A&P: 26 y.o. G2P1001 [redacted]w[redacted]d   #Labor: Progressing well. MVUs remain inadequate. Will continue to titrate Pitocin to achieve adequate pattern. Will plan to recheck after MVUs have been adequate for 2-3 hours, sooner as needed.  #Pain: Epidural  #FWB: Cat 1  #GBS negative  [redacted]w[redacted]d, MD 11:00 PM

## 2022-06-05 NOTE — Progress Notes (Addendum)
Patient ID: Jasmine Decker, female   DOB: 1996-11-30, 26 y.o.   MRN: [id number]  Jasmine Decker is a 26 y.o. G2P1001 at [redacted]w[redacted]d admitted for induction of labor due to BMI 47.  Subjective: Patient feeling better after balloon out. Is ok with check and possible AROM at this time.   Objective: BP 138/70   Pulse 85   Temp 98.1 F (36.7 C) (Oral)   Resp 18   Ht 5\' 3"  (1.6 m)   Wt 120.4 kg   LMP 09/05/2021   BMI 47.01 kg/m  No intake/output data recorded. No intake/output data recorded.  FHT:  FHR: 150 bpm, variability: moderate,  accelerations:  Present,  decelerations:  Absent UC:   regular, every 2-3 minutes SVE:   Dilation: 3.5 Effacement (%): 80 Station: -2, -3 Exam by:: [email or web address]  Labs: Lab Results  Component Value Date   WBC 11.2 (H) 06/05/2022   HGB 10.5 (L) 06/05/2022   HCT 33.3 (L) 06/05/2022   MCV 83.7 06/05/2022   PLT 453 (H) 06/05/2022    Assessment / Plan: G2P1001 at [redacted]w[redacted]d admitted for induction of labor due to BMI 47.  Labor:  Patient now s/p cytotec x3 and foley balloon. Cervix 3.5cm. AROM performed by Dr. [redacted]w[redacted]d during current check. Moderate to heavy amount of clear fluid expelled. Tolerated well by fetus and patient. Will monitor contraction frequency and start pitocin if warranted.   Fetal Wellbeing:  Category I Pain Control:   Patient desires epidural eventually, planning on going on a walk and then plans on epidural. I/D:   GBS negative   Ephriam Jenkins, Medical Student 06/05/2022, 2:27 PM  GME ATTESTATION:  I saw and evaluated the patient. I agree with the findings and the plan of care as documented in the student's note and have made all necessary edits. Patient progressing and now s/p cytotec and FB. AROM performed by me successfully and will plan to manage expectantly. Can add pitocin if contraction pattern spaced out.  06/07/2022, MD, MPH OB Fellow, Faculty Practice [facility], Center for [facility] 06/05/2022 2:43 PM

## 2022-06-05 NOTE — Anesthesia Preprocedure Evaluation (Signed)
Anesthesia Evaluation  Patient identified by MRN, date of birth, ID band Patient awake    Reviewed: Allergy & Precautions, NPO status , Patient's Chart, lab work & pertinent test results  Airway Mallampati: III  TM Distance: >3 FB Neck ROM: Full    Dental no notable dental hx. (+) Teeth Intact   Pulmonary neg pulmonary ROS,    Pulmonary exam normal breath sounds clear to auscultation       Cardiovascular negative cardio ROS Normal cardiovascular exam Rhythm:Regular Rate:Normal     Neuro/Psych  Headaches, PSYCHIATRIC DISORDERS    GI/Hepatic Neg liver ROS, GERD  ,  Endo/Other  Morbid obesity  Renal/GU negative Renal ROS  negative genitourinary   Musculoskeletal negative musculoskeletal ROS (+)   Abdominal (+) + obese,   Peds  Hematology  (+) Blood dyscrasia, anemia ,   Anesthesia Other Findings   Reproductive/Obstetrics (+) Pregnancy                             Anesthesia Physical  Anesthesia Plan  ASA: III  Anesthesia Plan: Epidural   Post-op Pain Management:    Induction:   PONV Risk Score and Plan:   Airway Management Planned: Natural Airway  Additional Equipment:   Intra-op Plan:   Post-operative Plan:   Informed Consent: I have reviewed the patients History and Physical, chart, labs and discussed the procedure including the risks, benefits and alternatives for the proposed anesthesia with the patient or authorized representative who has indicated his/her understanding and acceptance.       Plan Discussed with: Anesthesiologist  Anesthesia Plan Comments:         Anesthesia Quick Evaluation

## 2022-06-06 ENCOUNTER — Encounter (HOSPITAL_COMMUNITY): Payer: Self-pay | Admitting: Obstetrics & Gynecology

## 2022-06-06 DIAGNOSIS — Z3A39 39 weeks gestation of pregnancy: Secondary | ICD-10-CM

## 2022-06-06 DIAGNOSIS — O99214 Obesity complicating childbirth: Secondary | ICD-10-CM

## 2022-06-06 MED ORDER — LACTATED RINGERS AMNIOINFUSION: INTRAVENOUS | Status: DC

## 2022-06-06 MED ORDER — DIPHENHYDRAMINE HCL 25 MG PO CAPS: 25.0000 mg | ORAL_CAPSULE | Freq: Four times a day (QID) | ORAL | Status: DC | PRN

## 2022-06-06 MED ORDER — IBUPROFEN 600 MG PO TABS
600.0000 mg | ORAL_TABLET | Freq: Four times a day (QID) | ORAL | Status: DC
Start: 2022-06-06 — End: 2022-06-07
  Administered 2022-06-06 – 2022-06-07 (×4): 600 mg via ORAL
  Filled 2022-06-06 (×6): qty 1

## 2022-06-06 MED ORDER — BENZOCAINE-MENTHOL 20-0.5 % EX AERO
1.0000 | INHALATION_SPRAY | CUTANEOUS | Status: DC | PRN
  Administered 2022-06-06 – 2022-06-07 (×2): 1 via TOPICAL
  Filled 2022-06-06 (×2): qty 56

## 2022-06-06 MED ORDER — OXYCODONE HCL 5 MG PO TABS: 10.0000 mg | ORAL_TABLET | ORAL | Status: DC | PRN

## 2022-06-06 MED ORDER — DIBUCAINE (PERIANAL) 1 % EX OINT: 1.0000 | TOPICAL_OINTMENT | CUTANEOUS | Status: DC | PRN

## 2022-06-06 MED ORDER — COCONUT OIL OIL: 1.0000 | TOPICAL_OIL | Status: DC | PRN

## 2022-06-06 MED ORDER — MEASLES, MUMPS & RUBELLA VAC IJ SOLR
0.5000 mL | Freq: Once | INTRAMUSCULAR | Status: AC
  Administered 2022-06-07: 0.5 mL via SUBCUTANEOUS
  Filled 2022-06-06: qty 0.5

## 2022-06-06 MED ORDER — PRENATAL MULTIVITAMIN CH
1.0000 | ORAL_TABLET | Freq: Every day | ORAL | Status: DC
  Administered 2022-06-06 – 2022-06-07 (×2): 1 via ORAL
  Filled 2022-06-06 (×2): qty 1

## 2022-06-06 MED ORDER — ACETAMINOPHEN 325 MG PO TABS: 650.0000 mg | ORAL_TABLET | ORAL | Status: DC | PRN

## 2022-06-06 MED ORDER — SIMETHICONE 80 MG PO CHEW: 80.0000 mg | CHEWABLE_TABLET | ORAL | Status: DC | PRN

## 2022-06-06 MED ORDER — WITCH HAZEL-GLYCERIN EX PADS: 1.0000 | MEDICATED_PAD | CUTANEOUS | Status: DC | PRN

## 2022-06-06 MED ORDER — ONDANSETRON HCL 4 MG/2ML IJ SOLN: 4.0000 mg | INTRAMUSCULAR | Status: DC | PRN

## 2022-06-06 MED ORDER — OXYCODONE HCL 5 MG PO TABS: 5.0000 mg | ORAL_TABLET | ORAL | Status: DC | PRN

## 2022-06-06 MED ORDER — SENNOSIDES-DOCUSATE SODIUM 8.6-50 MG PO TABS
2.0000 | ORAL_TABLET | Freq: Every day | ORAL | Status: DC
  Administered 2022-06-07: 2 via ORAL
  Filled 2022-06-06: qty 2

## 2022-06-06 MED ORDER — ONDANSETRON HCL 4 MG PO TABS: 4.0000 mg | ORAL_TABLET | ORAL | Status: DC | PRN

## 2022-06-06 NOTE — Progress Notes (Signed)
Labor Progress Note Monzerat Victor is a 26 y.o. G2P1001 at [redacted]w[redacted]d who presented for IOL due to BMI 47.  S: Doing well. Pushing with good effort. No concerns.   O:  BP 110/67   Pulse 77   Temp 97.9 F (36.6 C) (Oral)   Resp 18   Ht 5\' 3"  (1.6 m)   Wt 120.4 kg   LMP 09/05/2021   SpO2 99%   BMI 47.01 kg/m   EFM: Baseline 125 bpm, moderate variability, + accels, variable decels  Toco: Every 1.5-3 minutes   CVE: Dilation: 10 Effacement (%): 70 Cervical Position: Middle Station: 0, Plus 1 Presentation: Vertex Exam by:: bria torrence,rn  A&P: 26 y.o. G2P1001 [redacted]w[redacted]d   #Labor: Progressing well. Now complete and plus 2 station with pushing. Anticipate delivery shortly.  #Pain: Epidural  #FWB: Cat 2. S/p amnioinfusion. Reassuring variability and recovers well from variable decels. Will continue to monitor.  #GBS negative  [redacted]w[redacted]d, MD 1:36 AM

## 2022-06-06 NOTE — Lactation Note (Signed)
This note was copied from a baby's chart. Lactation Consultation Note  Patient Name: Jasmine Decker [id number] Date: 06/06/2022 Reason for consult: Follow-up assessment;Term;Breastfeeding assistance;Other (Comment) (Baby awake, LC offered to check latch and assisted. Baby opened wide and was still feeding at 8 mins with swallows. Easily hand expressed milk. LC assisted to work on depth and positoning.) Latch score 9  Age:24 hours  Maternal Data    Feeding Mother's Current Feeding Choice: Breast Milk  LATCH Score Latch: Grasps breast easily, tongue down, lips flanged, rhythmical sucking.  Audible Swallowing: Spontaneous and intermittent  Type of Nipple: Everted at rest and after stimulation  Comfort (Breast/Nipple): Soft / non-tender  Hold (Positioning): Assistance needed to correctly position infant at breast and maintain latch.  LATCH Score: 9   Lactation Tools Discussed/Used    Interventions Interventions: Breast feeding basics reviewed;Assisted with latch;Skin to skin;Breast massage;Hand express;Breast compression;Adjust position;Support pillows;Position options;Education  Discharge    Consult Status Consult Status: Follow-up Date: 06/07/22 Follow-up type: In-patient    Matilde Sprang Anastasija Anfinson 06/06/2022, 3:29 PM

## 2022-06-06 NOTE — Discharge Summary (Signed)
Postpartum Discharge Summary  Date of Service updated***   Patient Name: Jasmine Decker DOB: 1996/05/21 MRN: [id number]  Date of admission: 06/05/2022 Delivery date:06/06/2022  Delivering provider: Genia Del  Date of discharge: 06/06/2022  Admitting diagnosis: Maternal obesity affecting pregnancy, antepartum [O99.210] Intrauterine pregnancy: [redacted]w[redacted]d    Secondary diagnosis:  Principal Problem:   Vaginal delivery Active Problems:   Maternal morbid obesity, antepartum ([place]   Encounter for supervision of normal pregnancy   Rubella non-immune status, antepartum   Abnormal prenatal test  Additional problems: ***    Discharge diagnosis: Term Pregnancy Delivered                                              Post partum procedures: {Postpartum procedures:23558} Augmentation: AROM, Pitocin, Cytotec, and IP Foley Complications: None  Hospital course: Induction of Labor With Vaginal Delivery   26y.o. yo G2P1001 at 35w1das admitted to the hospital 06/05/2022 for induction of labor.  Indication for induction:  Maternal obesity with BMI 47 .  Patient had an uncomplicated labor course and vaginal delivery.  Membrane Rupture Time/Date: 2:16 PM ,06/05/2022   Delivery Method:Vaginal, Spontaneous  Episiotomy: None  Lacerations:  1st degree;Periurethral;Perineal  Details of delivery can be found in separate delivery note.  Patient had a routine postpartum course.  She is eating, drinking, voiding, and ambulating without issue.  Her pain and bleeding are controlled.  She is *** feeding well.  Patient is discharged home 06/06/22.  Newborn Data: Birth date:06/06/2022  Birth time:1:44 AM  Gender:Female  Living status:  Apgars: ,  Weight:   Magnesium Sulfate received: No BMZ received: No Rhophylac: N/A MMR: Offered postpartum *** T-DaP: Given prenatally Flu: No Transfusion: No  Physical exam  Vitals:   06/06/22 0030 06/06/22 0130 06/06/22 0200 06/06/22 0205  BP: 110/67 128/86  (!) 114/53 111/66  Pulse: 77 (!) 122 89 95  Resp:      Temp:      TempSrc:      SpO2:      Weight:      Height:       General: {Exam; general:21111117} Lochia: {Desc; appropriate/inappropriate:30686::"appropriate"} Uterine Fundus: {Desc; firm/soft:30687} Incision: {Exam; incision:21111123} DVT Evaluation: {Exam; dvt:2111122}  Labs: Lab Results  Component Value Date   WBC 11.2 (H) 06/05/2022   HGB 10.5 (L) 06/05/2022   HCT 33.3 (L) 06/05/2022   MCV 83.7 06/05/2022   PLT 453 (H) 06/05/2022      Latest Ref Rng & Units 05/06/2022   10:29 AM  CMP  Glucose 65 - 139 mg/dL 84   BUN 7 - 25 mg/dL 8   Creatinine 0.50 - 0.96 mg/dL 0.38   Sodium 135 - 146 mmol/L 135   Potassium 3.5 - 5.3 mmol/L 4.6   Chloride 98 - 110 mmol/L 105   CO2 20 - 32 mmol/L 17   Calcium 8.6 - 10.2 mg/dL 9.2   Total Protein 6.1 - 8.1 g/dL 7.0   Total Bilirubin 0.2 - 1.2 mg/dL 0.7   AST 10 - 30 U/L 65   ALT 6 - 29 U/L 34    Edinburgh Score:     No data to display          After visit meds:  Allergies as of 06/06/2022   No Known Allergies   Med Rec must be completed prior to using  this [place]***       Discharge home in stable condition Infant Feeding: {Baby feeding:23562} Infant Disposition: {CHL IP OB HOME WITH AREQJE:83073} Discharge instruction: per After Visit Summary and Postpartum booklet. Activity: Advance as tolerated. Pelvic rest for 6 weeks.  Diet: routine diet Future Appointments: Future Appointments  Date Time Provider [place]  07/03/2022  9:50 AM Radene Gunning, MD [place] [facility]   Follow up Visit: Message sent to [place] by Dr. Gwenlyn Perking on 06/06/22.   Please schedule this patient for a In person postpartum visit in 6 weeks with the following provider: Any provider. Additional Postpartum F/U:  None   Low risk pregnancy complicated by:  Obesity Delivery mode:  Vaginal, Spontaneous  Anticipated Birth Control:   None  06/06/2022 Genia Del,  MD

## 2022-06-06 NOTE — Anesthesia Postprocedure Evaluation (Signed)
Anesthesia Post Note  Patient: Jasmine Decker  Procedure(s) Performed: AN AD HOC LABOR EPIDURAL     Patient location during evaluation: Mother Baby Anesthesia Type: Epidural Level of consciousness: awake and alert Pain management: pain level controlled Vital Signs Assessment: post-procedure vital signs reviewed and stable Respiratory status: spontaneous breathing, nonlabored ventilation and respiratory function stable Cardiovascular status: stable Postop Assessment: no headache, no backache, epidural receding, no apparent nausea or vomiting, patient able to bend at knees, adequate PO intake and able to ambulate Anesthetic complications: no   No notable events documented.  Last Vitals:  Vitals:   06/06/22 0401 06/06/22 0500  BP: 116/69 111/61  Pulse: 81 81  Resp: 18 18  Temp: 37.3 C 37.3 C  SpO2: 100% 100%    Last Pain:  Vitals:   06/06/22 0745  TempSrc:   PainSc: 1    Pain Goal: Patients Stated Pain Goal: 2 (06/05/22 1915)                 Laban Emperor

## 2022-06-06 NOTE — Lactation Note (Signed)
This note was copied from a baby's chart. Lactation Consultation Note Mom called for latch assistance. When LC entered rm. Baby was screaming. LC  held to calm down. Suck training w/gloved finger noted baby chomping and biting, uncoordinated suck swallow, more biting and chewing. Noted thick labial frenulum. Baby acts very hungry. Mom stated baby had been BF good earlier today but this evening not good. Mom has everted nipples baby will not do anything but scream at breast, but acts vigorous on gloved finger. Mom stated she is breast/bottle. Asked mom if she would like to pump mom stated yes. Mentioned to mom that if she supplements we would like her to pump and give her EBM first then if needed she can supplement w/the formula. Asked RN to set up DEBP for LC d/t extreme amount of pt. Waiting. RN stated she would. When LC left baby was BF well and calm.  Encouraged mom not to feed wrapped and LC removed hat. Baby was warm.  Patient Name: [name] [id number] Date: 06/06/2022 Reason for consult: Mother's request;Term Age:24 hours  Maternal Data Has patient been taught Hand Expression?: Yes  Feeding Mother's Current Feeding Choice: Breast Milk and Formula  LATCH Score Latch: Repeated attempts needed to sustain latch, nipple held in mouth throughout feeding, stimulation needed to elicit sucking reflex.  Audible Swallowing: None  Type of Nipple: Everted at rest and after stimulation  Comfort (Breast/Nipple): Soft / non-tender  Hold (Positioning): Assistance needed to correctly position infant at breast and maintain latch.  LATCH Score: 6   Lactation Tools Discussed/Used    Interventions Interventions: Breast feeding basics reviewed;Assisted with latch;Skin to skin;Breast massage;Hand express;Breast compression;Adjust position;Support pillows;Position options  Discharge    Consult Status Consult Status: Follow-up Date: 06/07/22 Follow-up type: In-patient    Charyl Dancer 06/06/2022, 9:09 PM

## 2022-06-06 NOTE — Lactation Note (Signed)
This note was copied from a baby's chart. Lactation Consultation Note  Patient Name: Jasmine Decker [id number] Date: 06/06/2022 Reason for consult: Initial assessment;1st time breastfeeding;[place] Age:24 hours   P1 mother whose infant is now 2 hours old.  This is a term baby at 39+1 weeks.  Mother's current feeding preference is breast.  Baby STS on mother's chest and beginning to show cues.  Offered to assist with latching; mother receptive.  Taught hand expression and finger fed two drops of colostrum to "Jasmine Decker."    Assisted to latch in the cross cradle hold and observed "Jasmine Decker" feeding for 5 minutes with frequent stimulation.  Swaddled at the end of her feeding and placed in bassinet so parents can sleep.  Breast feeding basics reviewed.  Mother will call for latch assistance as needed.     Maternal Data Has patient been taught Hand Expression?: Yes Does the patient have breastfeeding experience prior to this delivery?: No  Feeding Mother's Current Feeding Choice: Breast Milk  LATCH Score Latch: Repeated attempts needed to sustain latch, nipple held in mouth throughout feeding, stimulation needed to elicit sucking reflex.  Audible Swallowing: None  Type of Nipple: Everted at rest and after stimulation  Comfort (Breast/Nipple): Soft / non-tender  Hold (Positioning): Assistance needed to correctly position infant at breast and maintain latch.  LATCH Score: 6   Lactation Tools Discussed/Used    Interventions Interventions: Breast feeding basics reviewed;Assisted with latch;Skin to skin;Breast massage;Hand express;Breast compression;Position options;Support pillows;Adjust position;Education;LC Services brochure  Discharge    Consult Status Consult Status: Follow-up Date: 06/07/22 Follow-up type: In-patient    Dora Sims 06/06/2022, 4:38 AM

## 2022-06-07 MED ORDER — ACETAMINOPHEN 500 MG PO TABS: 1000.0000 mg | ORAL_TABLET | Freq: Three times a day (TID) | ORAL | 0 refills | Status: DC | PRN

## 2022-06-07 MED ORDER — IBUPROFEN 600 MG PO TABS: 600.0000 mg | ORAL_TABLET | Freq: Four times a day (QID) | ORAL | 0 refills | Status: DC | PRN

## 2022-06-07 NOTE — Lactation Note (Signed)
This note was copied from a baby's chart. Lactation Consultation Note  Patient Name: Girl Manju Kulkarni [id number] Date: 06/07/2022 Reason for consult: Follow-up assessment;Term Age:24 hours  LC in to visit with P2 Mom of term baby on day of discharge.  Mom is choosing to breast and formula feed.  Mom reports baby is latching well and denies any discomfort.  Baby has been getting large volumes of formula by bottle.    There is a pump set up in room.  Mom states she is pumping, but not expressing any milk.  Encouraged Mom to breastfeeding more than formula to support her milk supply.  Offered to assist with baby feeding at the breast, but Mom declined.  Engorgement prevention and treatment reviewed. Mom to take baby to [facility] and message sent to [facility] RN IBCLC.  Mom aware of OP lactation support and encouraged to call prn.  Lactation Tools Discussed/Used Tools: Pump;Flanges;Bottle  Interventions Interventions: Breast feeding basics reviewed;Skin to skin;Breast massage;Hand express;DEBP;Hand pump  Discharge Discharge Education: Engorgement and breast care;Warning signs for feeding baby;Outpatient recommendation;Outpatient Epic message sent Pump: Personal (Motif DEBP)  Consult Status Consult Status: Complete Date: 06/07/22 Follow-up type: Call as needed    Judee Clara 06/07/2022, 3:17 PM

## 2022-06-14 ENCOUNTER — Telehealth (HOSPITAL_COMMUNITY): Payer: Self-pay

## 2022-06-14 NOTE — Telephone Encounter (Signed)
Patient did not answer phone call. Voicemail left for patient.   Marcelino Duster [facility] 06/14/2022,1500

## 2022-07-03 ENCOUNTER — Ambulatory Visit: Payer: Commercial Managed Care - PPO | Admitting: Obstetrics and Gynecology

## 2022-07-18 ENCOUNTER — Ambulatory Visit (INDEPENDENT_AMBULATORY_CARE_PROVIDER_SITE_OTHER): Payer: Commercial Managed Care - PPO | Admitting: Certified Nurse Midwife

## 2022-07-18 ENCOUNTER — Other Ambulatory Visit (HOSPITAL_COMMUNITY)
Admission: RE | Admit: 2022-07-18 | Discharge: 2022-07-18 | Disposition: A | Discharge status: Home / Self Care | Payer: Commercial Managed Care - PPO | Source: Ambulatory Visit | Attending: Certified Nurse Midwife | Admitting: Certified Nurse Midwife

## 2022-07-18 ENCOUNTER — Encounter: Payer: Self-pay | Admitting: Certified Nurse Midwife

## 2022-07-18 VITALS — BP 112/77 | HR 84 | Ht 63.0 in | Wt 245.0 lb

## 2022-07-18 DIAGNOSIS — R87619 Unspecified abnormal cytological findings in specimens from cervix uteri: Secondary | ICD-10-CM | POA: Insufficient documentation

## 2022-07-18 DIAGNOSIS — Z124 Encounter for screening for malignant neoplasm of cervix: Secondary | ICD-10-CM | POA: Insufficient documentation

## 2022-07-18 DIAGNOSIS — R87612 Low grade squamous intraepithelial lesion on cytologic smear of cervix (LGSIL): Secondary | ICD-10-CM

## 2022-07-18 NOTE — Progress Notes (Signed)
Post Partum Visit Note  Jasmine Decker is a 26 y.o. G36P2002 female who presents for a postpartum visit. She is 6 weeks postpartum following a normal spontaneous vaginal delivery.  I have fully reviewed the prenatal and intrapartum course. The delivery was at 39.1 gestational weeks.  Anesthesia: epidural. Postpartum course has been unremarkable. Baby is doing well. Baby is feeding by bottle - Enfamil AR. Bleeding thin lochia. Bowel function is normal. Bladder function is normal. Patient is not sexually active. Contraception method is condoms. Postpartum depression screening: negative.   The pregnancy intention screening data noted above was reviewed. Potential methods of contraception were discussed. The patient elected to proceed with No data recorded.   Edinburgh Postnatal Depression Scale - 07/18/22 0916       Edinburgh Postnatal Depression Scale:  In the Past 7 Days   I have been able to laugh and see the funny side of things. 0    I have looked forward with enjoyment to things. 0    I have blamed myself unnecessarily when things went wrong. 0    I have been anxious or worried for no good reason. 0    I have felt scared or panicky for no good reason. 0    Things have been getting on top of me. 1    I have been so unhappy that I have had difficulty sleeping. 0    I have felt sad or miserable. 0    I have been so unhappy that I have been crying. 0    The thought of harming myself has occurred to me. 0    Edinburgh Postnatal Depression Scale Total 1             Health Maintenance Due  Topic Date Due   COVID-19 Vaccine (3 - Mixed Product series) 10/30/2021   INFLUENZA VACCINE  07/08/2022    The following portions of the patient's history were reviewed and updated as appropriate: allergies, current medications, past family history, past medical history, past social history, past surgical history, and problem list.  Review of Systems Pertinent items are noted in  HPI.  Objective:  BP 112/77   Pulse 84   Ht 5\' 3"  (1.6 m)   Wt 245 lb (111.1 kg)   LMP 09/05/2021   Breastfeeding No   BMI 43.40 kg/m    General:  alert, cooperative, and no distress   Breasts:  not indicated  Lungs: NWOB  Heart:  Reg rate  Abdomen: N/a    Wound N/a  GU exam:  normal, pap collected       Assessment:   1. Screening for cervical cancer   2. Postpartum exam   3. Low grade squamous intraepithelial lesion on cytologic smear of cervix (LGSIL)    Plan:   Essential components of care per ACOG recommendations:  1.  Mood and well being: Patient with negative depression screening today. Reviewed local resources for support.  - Patient tobacco use? No.   - hx of drug use? No.    2. Infant care and feeding:  -Patient currently breastmilk feeding? No.  -Social determinants of health (SDOH) reviewed in EPIC. No concerns  3. Sexuality, contraception and birth spacing - Patient does not want a pregnancy in the next year.  Desired family size is 2 children.  - Reviewed reproductive life planning. Reviewed contraceptive methods based on pt preferences and effectiveness.  Patient desired Female Condom today.   - Discussed birth spacing of 18 months  4. Sleep and fatigue -Encouraged family/partner/community support of 4 hrs of uninterrupted sleep to help with mood and fatigue  5. Physical Recovery  - Discussed patients delivery and complications. She describes her labor as good. - Patient had a Vaginal, no problems at delivery. Patient had a 1st degree laceration. Perineal healing reviewed. Patient expressed understanding - Patient has urinary incontinence? No. - Patient is safe to resume physical and sexual activity  6.  Health Maintenance - HM due items addressed Yes - Last pap smear  Diagnosis  Date Value Ref Range Status  06/05/2021 - Low grade squamous intraepithelial lesion (LSIL) (A)  Corrected   Pap smear done at today's visit.  -Breast Cancer screening  indicated? No.   7. Chronic Disease/Pregnancy Condition follow up: None  - PCP follow up  Donette Larry, CNM Center for [employer]

## 2022-07-23 LAB — CYTOLOGY - PAP
Comment: NEGATIVE
Diagnosis: UNDETERMINED — AB
High risk HPV: POSITIVE — AB

## 2023-12-09 NOTE — L&D Delivery Note (Cosign Needed Addendum)
 OB/GYN Faculty Practice Delivery Note  Jasmine Decker is a 28 y.o. [id number] s/p SVD at [redacted]w[redacted]d. She was admitted for IOL for LGA.   ROM: 8h 46m with clear, pink, fluid GBS Status: Negative/-- (11/20 1159)   Labor Progress: Augmented with cytotec  and pitocin . SROM around 4am. She then progressed to complete.   Delivery Date/Time: 11/18/24 at 1230 Delivery: Called to room and patient was complete and pushing. Head delivered LOA. No nuchal cord present. Shoulder and body delivered in usual fashion. Infant with spontaneous cry, placed on mother's abdomen, dried and stimulated. Cord clamped x 2 after 1-minute delay, and cut by FOB. Placenta delivered spontaneously with gentle cord traction. Fundus firm with massage and Pitocin . Labia, perineum, vagina, and cervix inspected with no lacerations.  Baby Weight: pending  Placenta: Sent to L&D, intact Complications: None Lacerations: None EBL: 50 mL Analgesia: Epidural   Infant:  APGAR (1 MIN): 9  APGAR (5 MINS): 9   Virali Bhagat, DO 11/18/2024 12:45 PM  Attestation of Supervision of Resident: Evaluation and management procedures were performed by the learners: Family Medicine Resident under my supervision. I was immediately available for direct supervision, assistance and direction throughout this encounter.  I also confirm that I have verified the information documented in the residents note, and that I have also personally reperformed the pertinent components of the physical exam and all of the medical decision making activities.  I have also made any necessary editorial changes.  Charlie DELENA Courts, MD FM-OB Fellow Center for [facility]

## 2024-04-03 IMAGING — US US MFM FETAL BPP W/ NON-STRESS
1 series · 14 of 26 positions shown · non-contrast
Comparison: none

[Series 1: us mfm fetal bpp w/ non-stress · 26 acquisitions, 14 frames shown]
[im 1/26]
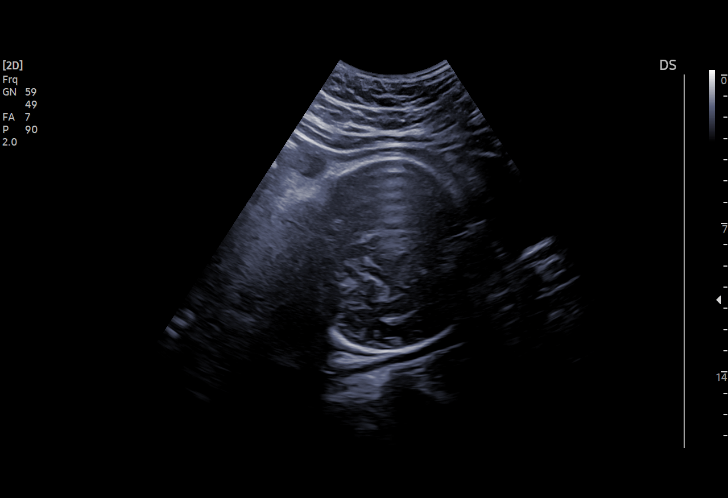
[im 3/26]
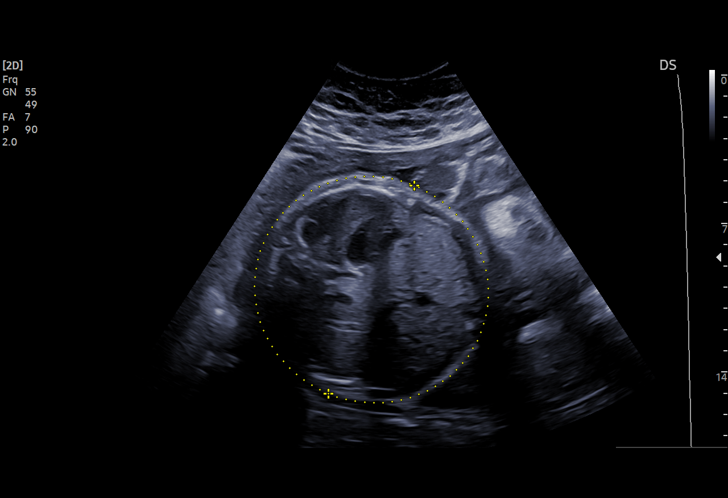
[im 5/26]
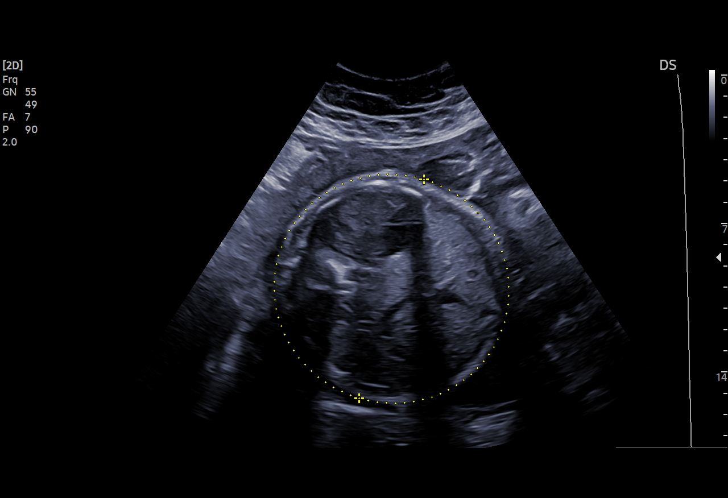
[im 7/26]
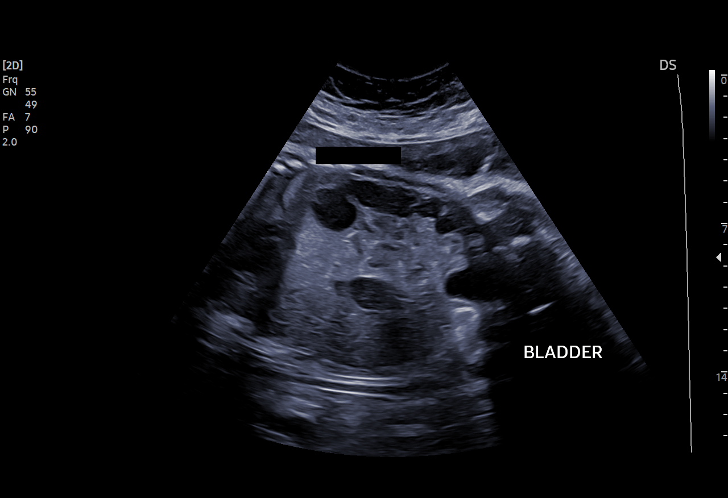
[im 9/26]
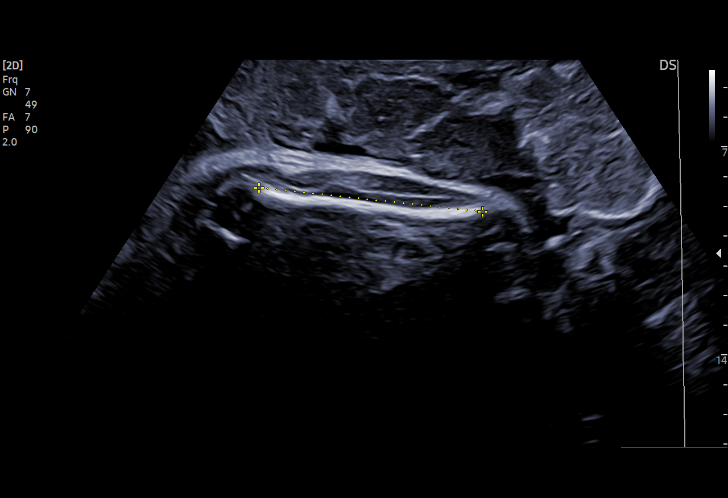
[im 11/26]
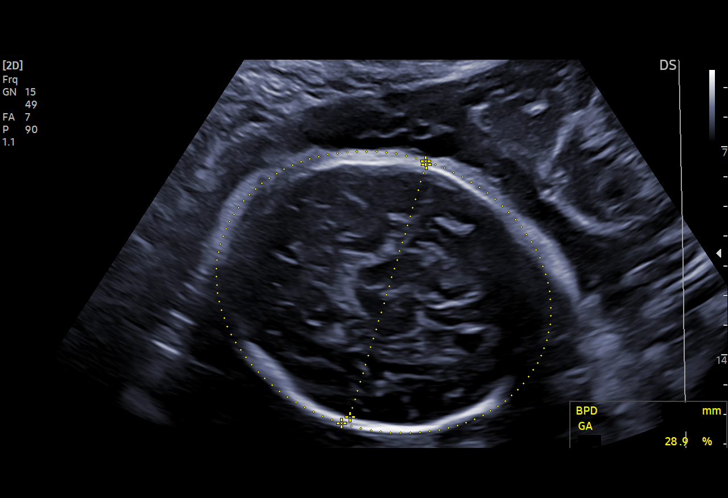
[im 13/26]
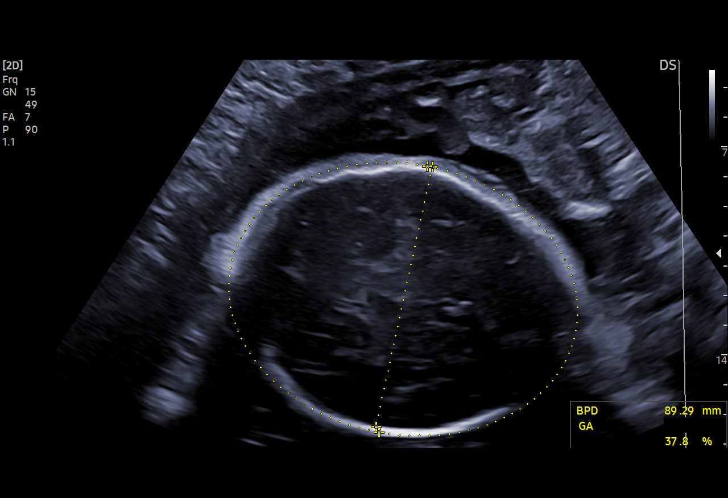
[im 14/26]
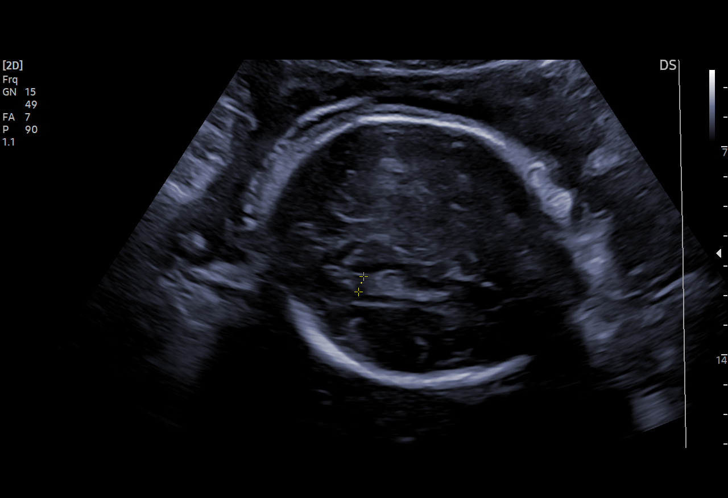
[im 16/26]
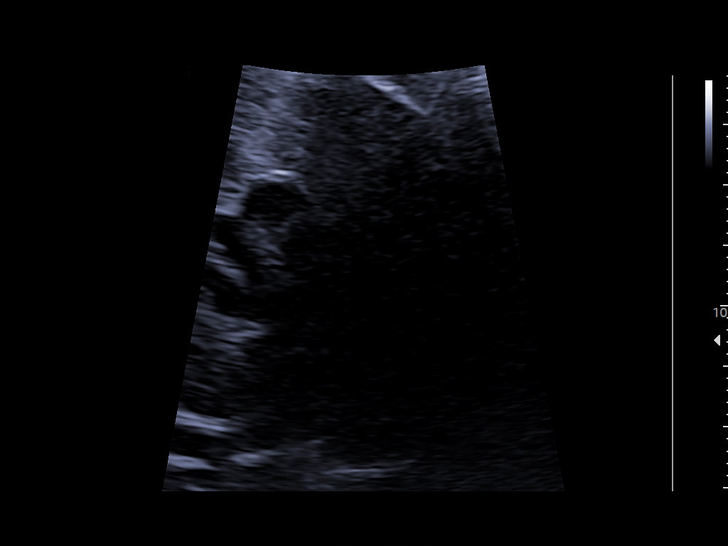
[im 18/26]
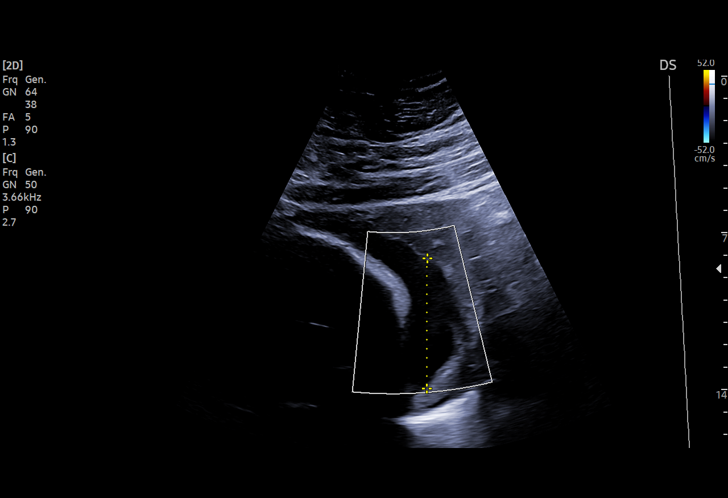
[im 20/26]
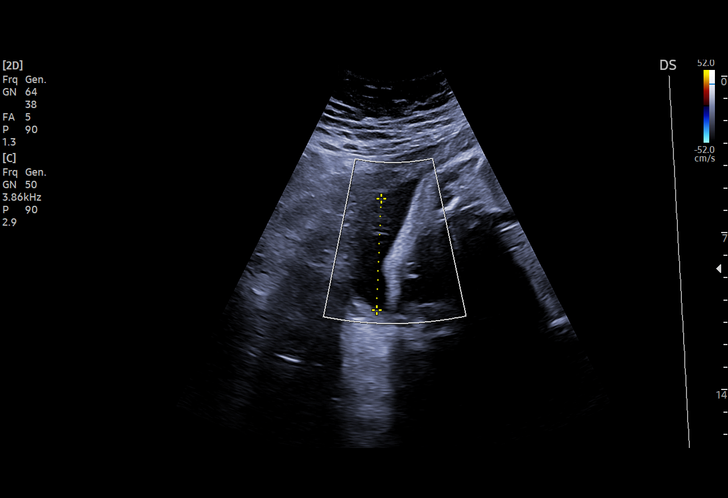
[im 22/26]
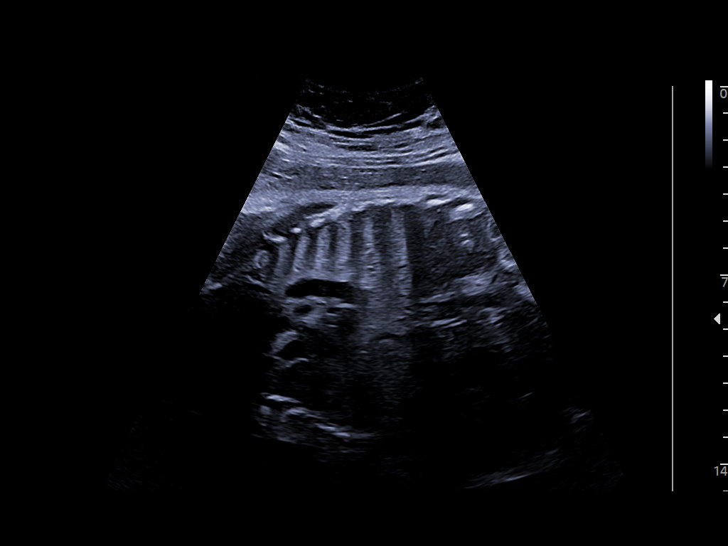
[im 24/26]
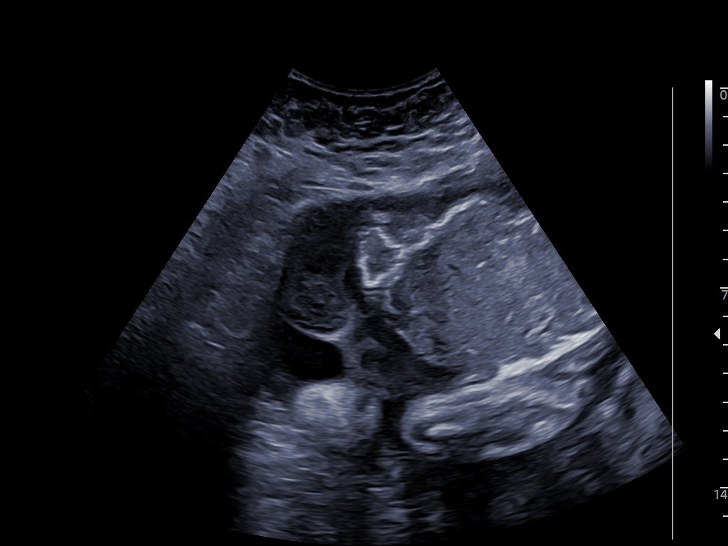
[im 26/26]
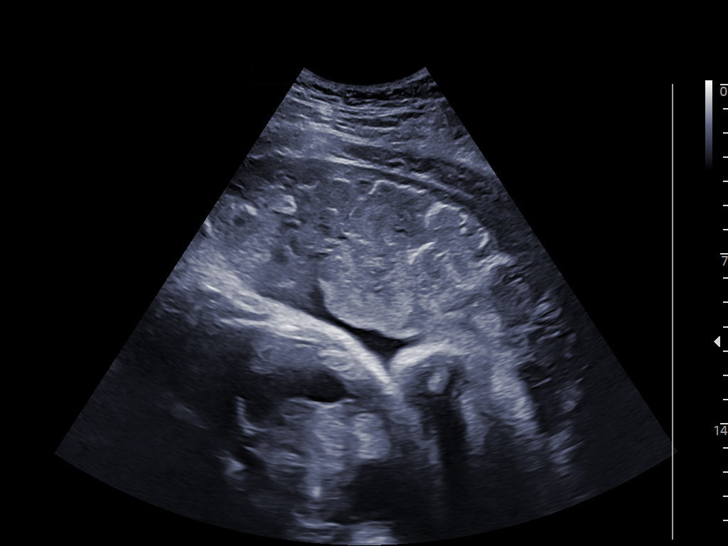

[14 of 26 positions shown; findings below may reference images not displayed]

NAKANISHI
    ZAES/NONSTRESS                                       NAKANISHI

Indications

 Obesity complicating pregnancy, third
 trimester BMI 44
 Abnormal chromosomal and genetic finding
 on antenatal screening of mother (low FF on
 NIPS)
 Medical complication of pregnancy (elevated
 LFTs)
 Neg AFP/Horizon
 37 weeks gestation of pregnancy
Fetal Evaluation

 Num Of Fetuses:         1
 Fetal Heart Rate(bpm):  132
 Cardiac Activity:       Observed
 Presentation:           Cephalic
 Placenta:               Anterior
 P. Cord Insertion:      Previously Visualized

 Amniotic Fluid
 AFI FV:      Within normal limits

 AFI Sum(cm)     %Tile       Largest Pocket(cm)
 17.44           66

 RUQ(cm)       RLQ(cm)       LUQ(cm)        LLQ(cm)

Biophysical Evaluation

 Amniotic F.V:   Pocket => 2 cm             F. Tone:        Not Observed
 F. Movement:    Observed                   N.S.T:          Reactive
 F. Breathing:   Observed                   Score:          [DATE]
Biometry

 BPD:     88.91  mm     G. Age:  36w 0d         33  %    CI:        73.88   %    70 - 86
                                                         FL/HC:      23.1   %    20.8 -
 HC:    328.54   mm     G. Age:  37w 2d         29  %    HC/AC:      0.96        0.92 -
 AC:    342.48   mm     G. Age:  38w 1d         87  %    FL/BPD:     85.2   %    71 - 87
 FL:      75.73  mm     G. Age:  38w 5d         85  %    FL/AC:      22.1   %    20 - 24
 LV:        5.4  mm

 Est. FW:    6655  gm      7 lb 6 oz     77  %
OB History

 Gravidity:    2         Term:   1        Prem:   0        SAB:   0
 TOP:          0       Ectopic:  0        Living: 1
Gestational Age

 LMP:           37w 1d        Date:  09/05/21                  EDD:   06/12/22
 U/S Today:     37w 4d                                        EDD:   06/09/22
 Best:          37w 1d     Det. By:  LMP  (09/05/21)          EDD:   06/12/22
Impression

 Antenatal testing performed given maternal elevated BMI
 The biophysical profile was [DATE] with good fetal movement and
 amniotic fluid volume.
Recommendations

 Continue weekly testing.

## 2024-04-26 ENCOUNTER — Ambulatory Visit (INDEPENDENT_AMBULATORY_CARE_PROVIDER_SITE_OTHER): Payer: Self-pay | Admitting: *Deleted

## 2024-04-26 ENCOUNTER — Other Ambulatory Visit: Payer: Self-pay

## 2024-04-26 VITALS — BP 117/76 | HR 90 | Wt 262.5 lb

## 2024-04-26 DIAGNOSIS — Z3480 Encounter for supervision of other normal pregnancy, unspecified trimester: Secondary | ICD-10-CM | POA: Insufficient documentation

## 2024-04-26 DIAGNOSIS — Z3481 Encounter for supervision of other normal pregnancy, first trimester: Secondary | ICD-10-CM

## 2024-04-26 DIAGNOSIS — Z1331 Encounter for screening for depression: Secondary | ICD-10-CM | POA: Diagnosis not present

## 2024-04-26 DIAGNOSIS — Z3A11 11 weeks gestation of pregnancy: Secondary | ICD-10-CM | POA: Diagnosis not present

## 2024-04-26 DIAGNOSIS — Z362 Encounter for other antenatal screening follow-up: Secondary | ICD-10-CM

## 2024-04-26 NOTE — Progress Notes (Signed)
 New OB Intake  I connected with Jasmine Decker  on 04/26/24 at  1:10 PM EDT by In Person Visit and verified that I am speaking with the correct person using two identifiers. Nurse is located at [facility] and pt is located at [place].  I discussed the limitations, risks, security and privacy concerns of performing an evaluation and management service by telephone and the availability of in person appointments. I also discussed with the patient that there may be a patient responsible charge related to this service. The patient expressed understanding and agreed to proceed.  I explained I am completing New OB Intake today. We discussed EDD of Not found.. Pt is [id number]. I reviewed her allergies, medications and Medical/Surgical/OB history.    Patient Active Problem List   Diagnosis Date Noted   Abnormal Pap smear of cervix 07/18/2022     Concerns addressed today  Delivery Plans Plans to deliver at [facility]. Discussed the nature of our practice with multiple providers including residents and students. Due to the size of the practice, the delivering provider may not be the same as those providing prenatal care.   Patient is not interested in water birth.  MyChart/Babyscripts MyChart access verified. I explained pt will have some visits in office and some virtually. Babyscripts instructions given and order placed. Patient verifies receipt of registration text/e-mail. Account successfully created and app downloaded. If patient is a candidate for Optimized scheduling, add to sticky note.   Blood Pressure Cuff/Weight Scale Blood pressure cuff ordered for patient to pick-up from [employer]. Explained after first prenatal appt pt will check weekly and document in Babyscripts. Patient does not have weight scale; patient may purchase if they desire to track weight weekly in Babyscripts.  Anatomy US  Explained first scheduled US  will be around 19 weeks. Anatomy US  scheduled for TBD at TBD.  Is  patient a candidate for Babyscripts Optimization? No, due to Risk Factors   First visit review I reviewed new OB appt with patient. Explained pt will be seen by Jasmine Percy, PA at first visit. Discussed Jasmine Decker genetic screening with patient. Requests Panorama . Routine prenatal labs OB Urineonly collected at today's visit. Initial OB labs deferred to New OB.   Last Pap Diagnosis  Date Value Ref Range Status  07/18/2022 (A)  Final   - Atypical squamous cells of undetermined significance (ASC-US )    Jasmine Furlong, RN 04/26/2024  1:27 PM

## 2024-04-26 NOTE — Patient Instructions (Signed)

## 2024-04-28 LAB — URINE CULTURE, OB REFLEX

## 2024-04-28 LAB — CULTURE, OB URINE

## 2024-05-03 ENCOUNTER — Ambulatory Visit: Payer: Self-pay | Admitting: Obstetrics and Gynecology

## 2024-05-11 DIAGNOSIS — Z3480 Encounter for supervision of other normal pregnancy, unspecified trimester: Secondary | ICD-10-CM | POA: Insufficient documentation

## 2024-05-11 NOTE — Progress Notes (Unsigned)
   PRENATAL VISIT NOTE  Subjective:  Jasmine Decker is a 28 y.o. G3P2002 at [redacted]w[redacted]d being seen today for her first prenatal visit for this pregnancy.  She is currently monitored for the following issues for this {Blank single:19197::"high-risk","low-risk"} pregnancy and has Abnormal Pap smear of cervix and Supervision of other normal pregnancy, antepartum on their problem list.  Patient reports {sx:14538}.   .  .   . Denies leaking of fluid.   She is planning to {Blank single:19197::"breastfeed","bottle feed"}. Desires *** for contraception.   The following portions of the patient's history were reviewed and updated as appropriate: allergies, current medications, past family history, past medical history, past social history, past surgical history and problem list.   Objective:  There were no vitals filed for this visit.  Fetal Status:           General:  Alert, oriented and cooperative. Patient is in no acute distress.  Skin: Skin is warm and dry. No rash noted.   Cardiovascular: Normal heart rate and rhythm noted  Respiratory: Normal respiratory effort, no problems with respiration noted. Clear to auscultation.   Abdomen: Soft, gravid, appropriate for gestational age. Normal bowel sounds. Non-tender.       Pelvic: {Blank single:19197::"Cervical exam performed","Cervical exam deferred"}       Normal cervical contour, no lesions, no bleeding following pap, normal discharge  Extremities: Normal range of motion.     Mental Status: Normal mood and affect. Normal behavior. Normal judgment and thought content.    Indications for ASA therapy (per uptodate) One of the following: Previous pregnancy with preeclampsia, especially early onset and with an adverse outcome {yes/no:20286} Multifetal gestation {yes/no:20286} Chronic hypertension {yes/no:20286} Type 1 or 2 diabetes mellitus {yes/no:20286} Chronic kidney disease {yes/no:20286} Autoimmune disease (antiphospholipid syndrome, systemic  lupus erythematosus) {yes/no:20286}  Two or more of the following: Nulliparity No Obesity (body mass index >30 kg/m2) Yes Family history of preeclampsia in mother or sister {yes/no:20286} Age >=35 years No Sociodemographic characteristics (African American race, low socioeconomic level) {yes/no:20286} Personal risk factors (eg, previous pregnancy with low birth weight or small for gestational age infant, previous adverse pregnancy outcome [eg, stillbirth], interval >10 years between pregnancies) {yes/no:20286}  Assessment and Plan:  Pregnancy: G3P2002 at [redacted]w[redacted]d  1. Supervision of other normal pregnancy, antepartum (Primary) Initial labs drawn. Continue prenatal vitamins. Genetic Screening discussed: NIPS, carrier screening and AFP *** Ultrasound discussed; fetal anatomic survey: *** Problem list reviewed and updated. Reviewed Brx optimized schedule, patient agreeable The nature of [facility] with multiple MDs and other Advanced Practice Providers was explained to patient; also emphasized that residents, students are part of our team. Routine obstetric precautions reviewed.   2. [redacted] weeks gestation of pregnancy Anticipatory guidance about next visits/weeks of pregnancy given.    Preterm labor/first trimester warning symptoms and general obstetric precautions including but not limited to vaginal bleeding, contractions, leaking of fluid and fetal movement were reviewed in detail with the patient. Please refer to After Visit Summary for other counseling recommendations.    No follow-ups on file.  Future Appointments  Date Time Provider Department Center  05/12/2024  1:10 PM Dorota Heinrichs E, PA-C [facility] None    Aleksey Newbern E Coulson Wehner, PA-C

## 2024-05-12 ENCOUNTER — Other Ambulatory Visit (HOSPITAL_COMMUNITY)
Admission: RE | Admit: 2024-05-12 | Discharge: 2024-05-12 | Disposition: A | Discharge status: Home / Self Care | Source: Ambulatory Visit | Attending: Physician Assistant | Admitting: Physician Assistant

## 2024-05-12 ENCOUNTER — Ambulatory Visit: Admitting: Physician Assistant

## 2024-05-12 VITALS — BP 119/74 | HR 94 | Wt 263.7 lb

## 2024-05-12 DIAGNOSIS — Z3A11 11 weeks gestation of pregnancy: Secondary | ICD-10-CM | POA: Diagnosis not present

## 2024-05-12 DIAGNOSIS — Z6841 Body Mass Index (BMI) 40.0 and over, adult: Secondary | ICD-10-CM | POA: Diagnosis not present

## 2024-05-12 DIAGNOSIS — Z3A12 12 weeks gestation of pregnancy: Secondary | ICD-10-CM

## 2024-05-12 DIAGNOSIS — Z3480 Encounter for supervision of other normal pregnancy, unspecified trimester: Secondary | ICD-10-CM | POA: Insufficient documentation

## 2024-05-12 DIAGNOSIS — Z1331 Encounter for screening for depression: Secondary | ICD-10-CM | POA: Diagnosis not present

## 2024-05-12 DIAGNOSIS — R519 Headache, unspecified: Secondary | ICD-10-CM | POA: Diagnosis not present

## 2024-05-12 DIAGNOSIS — Z3481 Encounter for supervision of other normal pregnancy, first trimester: Secondary | ICD-10-CM

## 2024-05-12 NOTE — Progress Notes (Signed)
 Pt presents for new ob. Pt has no questions or cocnerns at this time.

## 2024-05-12 NOTE — Patient Instructions (Signed)
 Safe Over-The-Counter Medications in Pregnancy   Acne:  Benzoyl Peroxide  Salicylic Acid   Backache/Headache:  Tylenol : 2 regular strength every 4 hours OR               2 Extra strength every 6 hours   Colds/Coughs/Allergies:  Benadryl  (alcohol free) 25 mg every 6 hours as needed  Breath right strips  Claritin  Cepacol throat lozenges  Chloraseptic throat spray  Cold-Eeze- up to three times per day  Cough drops, alcohol free  Flonase (by prescription only)  Guaifenesin  Mucinex  Robitussin DM (plain only, alcohol free)  Saline nasal spray/drops  Sudafed (pseudoephedrine) & Actifed * use only after [redacted] weeks gestation and if you do not have high blood pressure  Tylenol   Vicks Vaporub  Zinc lozenges  Zyrtec   Constipation:  Colace  Ducolax suppositories  Fleet enema  Glycerin  suppositories  Metamucil  Milk of magnesia  Miralax  Senokot  Smooth move tea   Diarrhea:  Kaopectate  Imodium A-D   *NO pepto Bismol*  Hemorrhoids:  Anusol  Anusol HC  Preparation H  Tucks   Indigestion:  Tums  Maalox  Mylanta  Zantac  Pepcid   Insomnia:  Benadryl  (alcohol free) 25mg  every 6 hours as needed  Tylenol  PM  Unisom, no Gelcaps   Leg Cramps:  Tums  MagGel   Nausea/Vomiting:  Bonine  Dramamine  Emetrol  Ginger extract  Sea bands  Meclizine  Nausea medication to take during pregnancy:  Unisom (doxylamine succinate 25 mg tablets) Take one tablet daily at bedtime. If symptoms are not adequately controlled, the dose can be increased to a maximum recommended dose of two tablets daily (1/2 tablet in the morning, 1/2 tablet mid-afternoon and one at bedtime).  Vitamin B6 100mg  tablets. Take one tablet twice a day (up to 200 mg per day).   Skin Rashes:  Aveeno products  Benadryl  cream or 25mg  every 6 hours as needed  Calamine Lotion  1% cortisone cream   Yeast infection:  Gyne-lotrimin 7  Monistat 7   **If taking multiple medications, please check labels to  avoid duplicating the same active ingredients  **take medication as directed on the label  ** Do not exceed 4000 mg of tylenol  in 24 hours  **Do not take medications that contain aspirin or ibuprofen      While you are pregnant, there are some foods you should not eat or eat only in small amounts:  Certain types of cooked fish--While you're pregnant, do not eat bigeye tuna, king mackerel, marlin, orange roughy, shark, swordfish, or tilefish. Limit white (albacore) tuna to only 6 ounces a week. These fish may have high levels of mercury, which can be harmful during pregnancy. All other types of cooked fish are safe and healthy for you and your pregnancy. Try to eat at least two servings of fish or shellfish per week.  Caffeine --Caffeine  is found in coffee, tea, chocolate, energy drinks, and soft drinks. It's a good idea to limit your daily intake of caffeine  to less than 200 milligrams, which is the amount in one 12-ounce cup of coffee. Limiting caffeine  can help with nausea and sleep problems.  Sushi--Raw fish may be harmful during pregnancy. Cooked sushi is fine.  Unpasteurized milk and cheese--These foods can cause a disease called listeriosis. Avoid cheeses that are made with raw milk (such as some feta, queso fresco, and blue cheeses). Hot dogs and lunch meats can also cause this disease, although it's rare. To be on  the safe side, only eat hot dogs and lunch meats that have been heated until steaming hot.

## 2024-05-13 LAB — CERVICOVAGINAL ANCILLARY ONLY
Chlamydia: NEGATIVE
Comment: NEGATIVE
Comment: NEGATIVE
Comment: NORMAL
Neisseria Gonorrhea: NEGATIVE
Trichomonas: NEGATIVE

## 2024-05-14 LAB — CBC/D/PLT+RPR+RH+ABO+RUBIGG...
Antibody Screen: NEGATIVE
Basophils Absolute: 0 10*3/uL (ref 0.0–0.2)
Basos: 0 %
EOS (ABSOLUTE): 0.1 10*3/uL (ref 0.0–0.4)
Eos: 1 %
HCV Ab: NONREACTIVE
HIV Screen 4th Generation wRfx: NONREACTIVE
Hematocrit: 33.7 % — ABNORMAL LOW (ref 34.0–46.6)
Hemoglobin: 10.9 g/dL — ABNORMAL LOW (ref 11.1–15.9)
Hepatitis B Surface Ag: NEGATIVE
Immature Grans (Abs): 0 10*3/uL (ref 0.0–0.1)
Immature Granulocytes: 0 %
Lymphocytes Absolute: 3.2 10*3/uL — ABNORMAL HIGH (ref 0.7–3.1)
Lymphs: 32 %
MCH: 28.3 pg (ref 26.6–33.0)
MCHC: 32.3 g/dL (ref 31.5–35.7)
MCV: 88 fL (ref 79–97)
Monocytes Absolute: 0.9 10*3/uL (ref 0.1–0.9)
Monocytes: 8 %
Neutrophils Absolute: 5.9 10*3/uL (ref 1.4–7.0)
Neutrophils: 59 %
Platelets: 325 10*3/uL (ref 150–450)
RBC: 3.85 x10E6/uL (ref 3.77–5.28)
RDW: 13.1 % (ref 11.7–15.4)
RPR Ser Ql: NONREACTIVE
Rh Factor: POSITIVE
Rubella Antibodies, IGG: 1.57 {index} (ref >0.99)
WBC: 10.1 10*3/uL (ref 3.4–10.8)

## 2024-05-14 LAB — HCV INTERPRETATION

## 2024-05-14 LAB — HEMOGLOBIN A1C
Est. average glucose Bld gHb Est-mCnc: 105 mg/dL
Hgb A1c MFr Bld: 5.3 % (ref 4.8–5.6)

## 2024-05-17 LAB — CYTOLOGY - PAP
Comment: NEGATIVE
Diagnosis: HIGH — AB
High risk HPV: POSITIVE — AB

## 2024-05-19 ENCOUNTER — Encounter: Payer: Self-pay | Admitting: Physician Assistant

## 2024-05-19 DIAGNOSIS — R87621 Atypical squamous cells cannot exclude high grade squamous intraepithelial lesion on cytologic smear of vagina (ASC-H): Secondary | ICD-10-CM | POA: Insufficient documentation

## 2024-05-23 ENCOUNTER — Ambulatory Visit (HOSPITAL_COMMUNITY): Payer: Self-pay | Admitting: Physician Assistant

## 2024-05-23 ENCOUNTER — Encounter: Payer: Self-pay | Admitting: Physician Assistant

## 2024-06-05 NOTE — Progress Notes (Deleted)
   PRENATAL VISIT NOTE  Subjective:  Jasmine Decker is a 28 y.o. G3P2002 at [redacted]w[redacted]d being seen today for ongoing prenatal care.  She is currently monitored for the following issues for this low risk pregnancy and has Abnormal Pap smear of cervix; Supervision of other normal pregnancy, antepartum; BMI 45.0-49.9, adult (HCC); and Vaginal Pap smear with ASC-H on their problem list.  Patient reports {sx:14538}.   .  .   . Denies leaking of fluid.   The following portions of the patient's history were reviewed and updated as appropriate: allergies, current medications, past family history, past medical history, past social history, past surgical history and problem list.   Objective:    There were no vitals filed for this visit.  Fetal Status:           General: Alert, oriented and cooperative. Patient is in no acute distress.  Skin: Skin is warm and dry. No rash noted.   Cardiovascular: Normal heart rate noted  Respiratory: Normal respiratory effort, no problems with respiration noted  Abdomen: Soft, gravid, appropriate for gestational age.        Pelvic: Cervical exam deferred        Extremities: Normal range of motion.     Mental Status: Normal mood and affect. Normal behavior. Normal judgment and thought content.   Assessment and Plan:  Pregnancy: G3P2002 at [redacted]w[redacted]d  1. Supervision of other normal pregnancy, antepartum (Primary) Patient doing well, feeling regular fetal movement BP, FHR, FH appropriate  2. [redacted] weeks gestation of pregnancy Anticipatory guidance regarding future visits/weeks of pregnancy given Schedule anatomy***  3. BMI 45.0-49.9, adult (HCC) ***  Preterm labor symptoms and general obstetric precautions including but not limited to vaginal bleeding, contractions, leaking of fluid and fetal movement were reviewed in detail with the patient. Please refer to After Visit Summary for other counseling recommendations.   No follow-ups on file.  Future Appointments   Date Time Provider Department Center  06/09/2024  2:30 PM Domique Reardon E, PA-C [facility] None    Emeri Estill E Cydni Reddoch, PA-C

## 2024-06-08 ENCOUNTER — Ambulatory Visit (INDEPENDENT_AMBULATORY_CARE_PROVIDER_SITE_OTHER): Admitting: Family Medicine

## 2024-06-08 VITALS — BP 129/76 | HR 101 | Wt 256.5 lb

## 2024-06-08 DIAGNOSIS — Z3A15 15 weeks gestation of pregnancy: Secondary | ICD-10-CM

## 2024-06-08 DIAGNOSIS — Z3480 Encounter for supervision of other normal pregnancy, unspecified trimester: Secondary | ICD-10-CM

## 2024-06-08 DIAGNOSIS — Z6841 Body Mass Index (BMI) 40.0 and over, adult: Secondary | ICD-10-CM

## 2024-06-08 NOTE — Progress Notes (Signed)
 Pt presents for rob. Pt has no questions or concerns at this time.

## 2024-06-08 NOTE — Progress Notes (Signed)
   PRENATAL VISIT NOTE  Subjective:  Jasmine Decker is a 28 y.o. G3P2002 at [redacted]w[redacted]d being seen today for ongoing prenatal care.  She is currently monitored for the following issues for this low-risk pregnancy and has Abnormal Pap smear of cervix; Supervision of other normal pregnancy, antepartum; BMI 45.0-49.9, adult (HCC); and Vaginal Pap smear with ASC-H on their problem list.  Patient reports no complaints.  Contractions: Not present. Vag. Bleeding: None.  Movement: Absent. Denies leaking of fluid.   The following portions of the patient's history were reviewed and updated as appropriate: allergies, current medications, past family history, past medical history, past social history, past surgical history and problem list.   Objective:   Vitals:   06/08/24 1358  BP: 129/76  Pulse: (!) 101  Weight: 256 lb 8 oz (116.3 kg)    Fetal Status: Fetal Heart Rate (bpm): 145   Movement: Absent     General:  Alert, oriented and cooperative. Patient is in no acute distress.  Skin: Skin is warm and dry. No rash noted.   Cardiovascular: Normal heart rate noted  Respiratory: Normal respiratory effort, no problems with respiration noted  Abdomen: Soft, gravid, appropriate for gestational age.  Pain/Pressure: Absent     Pelvic: Cervical exam deferred        Extremities: Normal range of motion.  Edema: None  Mental Status: Normal mood and affect. Normal behavior. Normal judgment and thought content.   Assessment and Plan:  Pregnancy: G3P2002 at [redacted]w[redacted]d 1. Supervision of other normal pregnancy, antepartum (Primary) Prenatal course reviewed BP, HR, FHR normal - AFP, Serum, Open Spina Bifida  2. BMI 45.0-49.9, adult (HCC) Will need 32 week growth  3. [redacted] weeks gestation of pregnancy - AFP, Serum, Open Spina Bifida  Preterm labor symptoms and general obstetric precautions including but not limited to vaginal bleeding, contractions, leaking of fluid and fetal movement were reviewed in detail with the  patient. Please refer to After Visit Summary for other counseling recommendations.   No follow-ups on file.  Future Appointments  Date Time Provider Department Center  07/07/2024  2:50 PM Zina Jerilynn LABOR, MD [facility] None    Alain Sor, MD

## 2024-06-09 ENCOUNTER — Encounter: Admitting: Physician Assistant

## 2024-06-09 DIAGNOSIS — Z6841 Body Mass Index (BMI) 40.0 and over, adult: Secondary | ICD-10-CM

## 2024-06-09 DIAGNOSIS — Z3A16 16 weeks gestation of pregnancy: Secondary | ICD-10-CM

## 2024-06-09 DIAGNOSIS — Z3480 Encounter for supervision of other normal pregnancy, unspecified trimester: Secondary | ICD-10-CM

## 2024-06-10 LAB — AFP, SERUM, OPEN SPINA BIFIDA
AFP MoM: 1.38
AFP Value: 29.2 ng/mL
Gest. Age on Collection Date: 15 wk
Maternal Age At EDD: 26.2 a
OSBR Risk 1 IN: 3810
Test Results:: NEGATIVE
Weight: 256 [lb_av]

## 2024-07-06 DIAGNOSIS — O99210 Obesity complicating pregnancy, unspecified trimester: Secondary | ICD-10-CM | POA: Insufficient documentation

## 2024-07-07 ENCOUNTER — Ambulatory Visit (INDEPENDENT_AMBULATORY_CARE_PROVIDER_SITE_OTHER): Admitting: Obstetrics and Gynecology

## 2024-07-07 VITALS — BP 117/72 | HR 85 | Wt 257.0 lb

## 2024-07-07 DIAGNOSIS — Z6841 Body Mass Index (BMI) 40.0 and over, adult: Secondary | ICD-10-CM

## 2024-07-07 DIAGNOSIS — Z3480 Encounter for supervision of other normal pregnancy, unspecified trimester: Secondary | ICD-10-CM | POA: Diagnosis not present

## 2024-07-07 DIAGNOSIS — Z3A20 20 weeks gestation of pregnancy: Secondary | ICD-10-CM | POA: Diagnosis not present

## 2024-07-07 NOTE — Progress Notes (Signed)
   PRENATAL VISIT NOTE  Subjective:  Jasmine Decker is a 28 y.o. G3P2002 at [redacted]w[redacted]d being seen today for ongoing prenatal care.  She is currently monitored for the following issues for this low-risk pregnancy and has Abnormal Pap smear of cervix; Supervision of other normal pregnancy, antepartum; BMI 45.0-49.9, adult (HCC); Vaginal Pap smear with ASC-H; and Obesity affecting pregnancy, antepartum on their problem list.  Patient doing well with no acute concerns today. She reports no complaints.  Contractions: Not present. Vag. Bleeding: None.  Movement: Present. Denies leaking of fluid.   The following portions of the patient's history were reviewed and updated as appropriate: allergies, current medications, past family history, past medical history, past social history, past surgical history and problem list. Problem list updated.  Objective:   Vitals:   07/07/24 1453  BP: 117/72  Pulse: 85  Weight: 257 lb (116.6 kg)    Fetal Status: Fetal Heart Rate (bpm): 141   Movement: Present     General:  Alert, oriented and cooperative. Patient is in no acute distress.  Skin: Skin is warm and dry. No rash noted.   Cardiovascular: Normal heart rate noted  Respiratory: Normal respiratory effort, no problems with respiration noted  Abdomen: Soft, gravid, appropriate for gestational age.  Pain/Pressure: Absent     Pelvic: Cervical exam deferred        Extremities: Normal range of motion.  Edema: None  Mental Status:  Normal mood and affect. Normal behavior. Normal judgment and thought content.   Assessment and Plan:  Pregnancy: G3P2002 at [redacted]w[redacted]d  1. Supervision of other normal pregnancy, antepartum (Primary) Continue routine prenatal care  2. [redacted] weeks gestation of pregnancy   3. BMI 45.0-49.9, adult (HCC)   Preterm labor symptoms and general obstetric precautions including but not limited to vaginal bleeding, contractions, leaking of fluid and fetal movement were reviewed in detail with the  patient.  Please refer to After Visit Summary for other counseling recommendations.   Return in about 4 weeks (around 08/04/2024) for ROB, in person.   Jerilynn Buddle, MD Faculty Attending Center for [facility]

## 2024-07-15 ENCOUNTER — Ambulatory Visit

## 2024-07-15 ENCOUNTER — Ambulatory Visit: Attending: Obstetrics and Gynecology | Admitting: Obstetrics

## 2024-07-15 ENCOUNTER — Other Ambulatory Visit: Payer: Self-pay | Admitting: *Deleted

## 2024-07-15 VITALS — BP 129/68

## 2024-07-15 DIAGNOSIS — Z3A21 21 weeks gestation of pregnancy: Secondary | ICD-10-CM | POA: Insufficient documentation

## 2024-07-15 DIAGNOSIS — O99212 Obesity complicating pregnancy, second trimester: Secondary | ICD-10-CM | POA: Diagnosis not present

## 2024-07-15 DIAGNOSIS — O99210 Obesity complicating pregnancy, unspecified trimester: Secondary | ICD-10-CM

## 2024-07-15 DIAGNOSIS — Z3480 Encounter for supervision of other normal pregnancy, unspecified trimester: Secondary | ICD-10-CM | POA: Diagnosis not present

## 2024-07-15 DIAGNOSIS — Z363 Encounter for antenatal screening for malformations: Secondary | ICD-10-CM | POA: Diagnosis present

## 2024-07-15 DIAGNOSIS — E669 Obesity, unspecified: Secondary | ICD-10-CM

## 2024-07-15 NOTE — Progress Notes (Signed)
 MFM Consult Note  Jasmine Decker is currently at 21 weeks and 1 day.  Her EDC of November 24, 2024 was based on a first trimester ultrasound [facility]  There is documentation of this ultrasound in Epic under her media tab from 04/18/2024.  She was seen for detailed fetal anatomy scan due to maternal obesity with a BMI of 46.  She denies any significant past medical history and denies any problems in her current pregnancy.    She had a cell free DNA test earlier in her pregnancy which indicated a low risk for trisomy 72, 77, and 13. A female fetus is predicted.   She was informed that the fetal growth and amniotic fluid level were appropriate for her gestational age.   There were no obvious fetal anomalies noted on today's ultrasound exam.  However, today's exam was limited due to maternal body habitus.  The patient was informed that anomalies may be missed due to technical limitations. If the fetus is in a suboptimal position or maternal habitus is increased, visualization of the fetus in the maternal uterus may be impaired.  Due to maternal obesity, we will continue to follow her with monthly growth ultrasounds throughout her pregnancy.    Weekly fetal testing should be started at around 34 weeks.    She will return in 4 weeks for another growth scan.    The patient stated that all of her questions were answered today.  A total of 30 minutes was spent counseling and coordinating the care for this patient.  Greater than 50% of the time was spent in direct face-to-face contact.

## 2024-08-04 ENCOUNTER — Encounter: Admitting: Obstetrics and Gynecology

## 2024-08-05 ENCOUNTER — Ambulatory Visit (INDEPENDENT_AMBULATORY_CARE_PROVIDER_SITE_OTHER): Admitting: Physician Assistant

## 2024-08-05 ENCOUNTER — Encounter: Payer: Self-pay | Admitting: Physician Assistant

## 2024-08-05 VITALS — BP 116/75 | HR 82 | Wt 260.4 lb

## 2024-08-05 DIAGNOSIS — Z6841 Body Mass Index (BMI) 40.0 and over, adult: Secondary | ICD-10-CM

## 2024-08-05 DIAGNOSIS — Z3A24 24 weeks gestation of pregnancy: Secondary | ICD-10-CM

## 2024-08-05 DIAGNOSIS — Z3480 Encounter for supervision of other normal pregnancy, unspecified trimester: Secondary | ICD-10-CM | POA: Diagnosis not present

## 2024-08-05 NOTE — Progress Notes (Signed)
   PRENATAL VISIT NOTE  Subjective:  Jasmine Decker is a 28 y.o. G3P2002 at [redacted]w[redacted]d being seen today for ongoing prenatal care.  She is currently monitored for the following issues for this low-risk pregnancy and has Abnormal Pap smear of cervix; Supervision of other normal pregnancy, antepartum; BMI 45.0-49.9, adult (HCC); Vaginal Pap smear with ASC-H; and Obesity affecting pregnancy, antepartum on their problem list.  Patient reports no complaints.  Contractions: Not present. Vag. Bleeding: None.  Movement: Present. Denies leaking of fluid.   The following portions of the patient's history were reviewed and updated as appropriate: allergies, current medications, past family history, past medical history, past social history, past surgical history and problem list.   Objective:    Vitals:   08/05/24 0859  BP: 116/75  Pulse: 82  Weight: 260 lb 6.4 oz (118.1 kg)    Fetal Status:  Fetal Heart Rate (bpm): 145 Fundal Height: 25 cm Movement: Present    General: Alert, oriented and cooperative. Patient is in no acute distress.  Skin: Skin is warm and dry. No rash noted.   Cardiovascular: Normal heart rate noted  Respiratory: Normal respiratory effort, no problems with respiration noted  Abdomen: Soft, gravid, appropriate for gestational age.  Pain/Pressure: Absent     Pelvic: Cervical exam deferred        Extremities: Normal range of motion.  Edema: None  Mental Status: Normal mood and affect. Normal behavior. Normal judgment and thought content.   Assessment and Plan:  Pregnancy: G3P2002 at [redacted]w[redacted]d  1. Supervision of other normal pregnancy, antepartum (Primary) Patient doing well, feeling regular fetal movement  BP, FHR, FH appropriate   2. [redacted] weeks gestation of pregnancy Anticipatory guidance about next visits/weeks of pregnancy given.   3. BMI 45.0-49.9, adult (HCC) 08/19/24 f/u US   Preterm labor symptoms and general obstetric precautions including but not limited to vaginal  bleeding, contractions, leaking of fluid and fetal movement were reviewed in detail with the patient.  Please refer to After Visit Summary for other counseling recommendations.   Return in about 4 weeks (around 09/02/2024) for LOB+GTT.  Future Appointments  Date Time Provider Department Center  08/19/2024 11:15 AM [facility] PROVIDER 1 [facility]  08/19/2024 11:30 AM [facility] US1 [facility]    Jorene FORBES Moats, PA-C

## 2024-08-05 NOTE — Progress Notes (Signed)
 Pt presents for ROB visit. No concerns

## 2024-08-19 ENCOUNTER — Ambulatory Visit

## 2024-08-19 ENCOUNTER — Ambulatory Visit: Attending: Obstetrics | Admitting: Obstetrics

## 2024-08-19 ENCOUNTER — Other Ambulatory Visit: Payer: Self-pay | Admitting: *Deleted

## 2024-08-19 DIAGNOSIS — O09212 Supervision of pregnancy with history of pre-term labor, second trimester: Secondary | ICD-10-CM | POA: Insufficient documentation

## 2024-08-19 DIAGNOSIS — Z362 Encounter for other antenatal screening follow-up: Secondary | ICD-10-CM | POA: Diagnosis not present

## 2024-08-19 DIAGNOSIS — O3662X0 Maternal care for excessive fetal growth, second trimester, not applicable or unspecified: Secondary | ICD-10-CM

## 2024-08-19 DIAGNOSIS — Z3A26 26 weeks gestation of pregnancy: Secondary | ICD-10-CM | POA: Diagnosis not present

## 2024-08-19 DIAGNOSIS — O3663X0 Maternal care for excessive fetal growth, third trimester, not applicable or unspecified: Secondary | ICD-10-CM | POA: Diagnosis not present

## 2024-08-19 DIAGNOSIS — E669 Obesity, unspecified: Secondary | ICD-10-CM

## 2024-08-19 DIAGNOSIS — O99212 Obesity complicating pregnancy, second trimester: Secondary | ICD-10-CM

## 2024-08-19 DIAGNOSIS — O99210 Obesity complicating pregnancy, unspecified trimester: Secondary | ICD-10-CM

## 2024-08-19 NOTE — Progress Notes (Signed)
 MFM Consult Note  Jasmine Decker is currently at 26 weeks and 1 day.  She has been followed due to maternal obesity with a BMI of 46.  She denies any problems since her last exam and reports feeling fetal movements throughout the day.    Sonographic findings Single intrauterine pregnancy at 26w 1d.  Fetal cardiac activity:  Observed and appears normal. Presentation: Cephalic. Interval fetal anatomy appears normal. Fetal biometry shows the estimated fetal weight of 2 pounds 7 ounces measures at the 94thpercentile. Amniotic fluid volume: Within normal limits. MVP: 7.83 cm. Placenta: Posterior.  There are limitations of prenatal ultrasound such as the inability to detect certain abnormalities due to poor visualization. Various factors such as fetal position, gestational age and maternal body habitus may increase the difficulty in visualizing the fetal anatomy.    The views of the fetal anatomy were visualized today.  There were no obvious fetal anomalies noted.  Due to maternal obesity, we will continue to follow her with monthly growth ultrasounds throughout her pregnancy.    Weekly fetal testing should be started at around 34 weeks.    She will return in 4 weeks for another growth scan.    The patient stated that all of her questions were answered today.  A total of 20 minutes was spent counseling and coordinating the care for this patient.  Greater than 50% of the time was spent in direct face-to-face contact.

## 2024-09-02 ENCOUNTER — Other Ambulatory Visit

## 2024-09-02 ENCOUNTER — Other Ambulatory Visit: Payer: Self-pay | Admitting: Physician Assistant

## 2024-09-02 ENCOUNTER — Ambulatory Visit: Admitting: Physician Assistant

## 2024-09-02 VITALS — BP 125/81 | HR 83 | Wt 260.4 lb

## 2024-09-02 DIAGNOSIS — L299 Pruritus, unspecified: Secondary | ICD-10-CM

## 2024-09-02 DIAGNOSIS — O3663X0 Maternal care for excessive fetal growth, third trimester, not applicable or unspecified: Secondary | ICD-10-CM | POA: Diagnosis not present

## 2024-09-02 DIAGNOSIS — Z6841 Body Mass Index (BMI) 40.0 and over, adult: Secondary | ICD-10-CM

## 2024-09-02 DIAGNOSIS — Z3A28 28 weeks gestation of pregnancy: Secondary | ICD-10-CM

## 2024-09-02 DIAGNOSIS — Z3480 Encounter for supervision of other normal pregnancy, unspecified trimester: Secondary | ICD-10-CM

## 2024-09-02 DIAGNOSIS — O3660X0 Maternal care for excessive fetal growth, unspecified trimester, not applicable or unspecified: Secondary | ICD-10-CM | POA: Insufficient documentation

## 2024-09-02 MED ORDER — NYSTATIN 100000 UNIT/GM EX POWD: CUTANEOUS | 0 refills | Status: DC

## 2024-09-02 MED ORDER — KETOCONAZOLE 2 % EX CREA: 1.0000 | TOPICAL_CREAM | Freq: Two times a day (BID) | CUTANEOUS | 1 refills | Status: DC

## 2024-09-02 NOTE — Progress Notes (Signed)
 Pt presents for rob. Pt complains of itching between her inner thighs. No discharge or itching inside the vagina.

## 2024-09-02 NOTE — Progress Notes (Signed)
 PRENATAL VISIT NOTE  Subjective:  Jasmine Decker is a 28 y.o. G3P2002 at [redacted]w[redacted]d being seen today for ongoing prenatal care.  She is currently monitored for the following issues for this high-risk pregnancy and has Abnormal Pap smear of cervix; Supervision of other normal pregnancy, antepartum; BMI 45.0-49.9, adult (HCC); Vaginal Pap smear with ASC-H; Obesity affecting pregnancy, antepartum; and LGA (large for gestational age) fetus affecting management of mother on their problem list.  Patient reports itchy rash on inner thighs and in folds where thorax meets legs. She has previously tried diaper rash cream and moisturizers for this without relief.   Contractions: Not present. Vag. Bleeding: None.  Movement: Present. Denies leaking of fluid.   The following portions of the patient's history were reviewed and updated as appropriate: allergies, current medications, past family history, past medical history, past social history, past surgical history and problem list.   Objective:    Vitals:   09/02/24 0947  BP: 125/81  Pulse: 83  Weight: 260 lb 6.4 oz (118.1 kg)    Fetal Status:  Fetal Heart Rate (bpm): 146 Fundal Height: 28 cm Movement: Present    General: Alert, oriented and cooperative. Patient is in no acute distress.  Skin: Skin is warm and dry. No rash noted.   Cardiovascular: Normal heart rate noted  Respiratory: Normal respiratory effort, no problems with respiration noted  Abdomen: Soft, gravid, appropriate for gestational age.  Pain/Pressure: Absent     Pelvic: +confluent, hyperpigmented rash of inner thighs and intertriginous folds.   Extremities: Normal range of motion.  Edema: None  Mental Status: Normal mood and affect. Normal behavior. Normal judgment and thought content.   Assessment and Plan:  Pregnancy: G3P2002 at [redacted]w[redacted]d  1. Supervision of other normal pregnancy, antepartum (Primary) Patient doing well, feeling regular fetal movement  BP, FHR, FH appropriate    2. [redacted] weeks gestation of pregnancy Anticipatory guidance about next visits/weeks of pregnancy given.   3. BMI 45.0-49.9, adult (HCC)  4. Excessive fetal growth affecting management of pregnancy in third trimester, single or unspecified fetus 08/19/24 EFW 1113 gm (94%), AC 86%, AFI WNL  F/u growth 11/7  Continue antenatal testing   5. Itching Pruritic, confluent, hyperpigmented rash of inner thighs and intertriginous folds. Previous failed trials of moisturizers and diaper rash cream. Leading dx intertrigo. Discussed behavioral modifications including keeping area dry, wearing cotton underwear, avoiding tight-fitting clothing, etc. Reassess at next prenatal visit.  - ketoconazole  (NIZORAL ) 2 % cream; Apply 1 Application topically 2 (two) times daily.  Dispense: 60 g; Refill: 1 - nystatin  (MYCOSTATIN /NYSTOP ) powder; Apply to affected area daily.  Dispense: 60 g; Refill: 0   Preterm labor symptoms and general obstetric precautions including but not limited to vaginal bleeding, contractions, leaking of fluid and fetal movement were reviewed in detail with the patient.  Please refer to After Visit Summary for other counseling recommendations.   Return in about 2 weeks (around 09/16/2024) for LOB.  Future Appointments  Date Time Provider Department Center  09/16/2024 11:15 AM Nicholaus Jorene FORBES DEVONNA [facility] None  09/23/2024  3:15 PM [facility] PROVIDER 1 [facility]  09/23/2024  3:30 PM [facility] US1 [facility]  10/14/2024 11:15 AM [facility] PROVIDER 1 [facility]  10/14/2024 11:30 AM [facility] US2 [facility]  10/21/2024 11:15 AM [facility] PROVIDER 1 [facility]  10/21/2024 11:30 AM [facility] US4 [facility]  10/28/2024 11:00 AM [facility] PROVIDER 1 [facility]  10/28/2024 11:30 AM [facility] US2 [facility]    Maritsa Hunsucker E  Nicholaus, PA-C

## 2024-09-03 ENCOUNTER — Ambulatory Visit: Payer: Self-pay | Admitting: Physician Assistant

## 2024-09-03 LAB — CBC
Hematocrit: 35.1 % (ref 34.0–46.6)
Hemoglobin: 11.2 g/dL (ref 11.1–15.9)
MCH: 27.9 pg (ref 26.6–33.0)
MCHC: 31.9 g/dL (ref 31.5–35.7)
MCV: 87 fL (ref 79–97)
Platelets: 330 x10E3/uL (ref 150–450)
RBC: 4.02 x10E6/uL (ref 3.77–5.28)
RDW: 12.3 % (ref 11.7–15.4)
WBC: 8.9 x10E3/uL (ref 3.4–10.8)

## 2024-09-03 LAB — RPR: RPR Ser Ql: NONREACTIVE

## 2024-09-03 LAB — GLUCOSE TOLERANCE, 2 HOURS W/ 1HR
Glucose, 1 hour: 143 mg/dL (ref 70–179)
Glucose, 2 hour: 81 mg/dL (ref 70–152)
Glucose, Fasting: 77 mg/dL (ref 70–91)

## 2024-09-03 LAB — HIV ANTIBODY (ROUTINE TESTING W REFLEX): HIV Screen 4th Generation wRfx: NONREACTIVE

## 2024-09-05 ENCOUNTER — Other Ambulatory Visit (HOSPITAL_COMMUNITY): Payer: Self-pay | Admitting: Physician Assistant

## 2024-09-05 DIAGNOSIS — L304 Erythema intertrigo: Secondary | ICD-10-CM

## 2024-09-05 MED ORDER — KETOCONAZOLE 2 % EX CREA: 1.0000 | TOPICAL_CREAM | Freq: Every day | CUTANEOUS | 1 refills | Status: DC

## 2024-09-16 ENCOUNTER — Encounter: Payer: Self-pay | Admitting: Physician Assistant

## 2024-09-16 ENCOUNTER — Ambulatory Visit

## 2024-09-16 ENCOUNTER — Ambulatory Visit (INDEPENDENT_AMBULATORY_CARE_PROVIDER_SITE_OTHER): Admitting: Physician Assistant

## 2024-09-16 ENCOUNTER — Other Ambulatory Visit

## 2024-09-16 VITALS — BP 114/71 | HR 88 | Wt 264.0 lb

## 2024-09-16 DIAGNOSIS — Z3480 Encounter for supervision of other normal pregnancy, unspecified trimester: Secondary | ICD-10-CM

## 2024-09-16 DIAGNOSIS — Z3A30 30 weeks gestation of pregnancy: Secondary | ICD-10-CM

## 2024-09-16 DIAGNOSIS — O3663X0 Maternal care for excessive fetal growth, third trimester, not applicable or unspecified: Secondary | ICD-10-CM

## 2024-09-16 DIAGNOSIS — Z6841 Body Mass Index (BMI) 40.0 and over, adult: Secondary | ICD-10-CM

## 2024-09-16 DIAGNOSIS — L304 Erythema intertrigo: Secondary | ICD-10-CM | POA: Diagnosis not present

## 2024-09-16 MED ORDER — KETOCONAZOLE 2 % EX CREA: 1.0000 | TOPICAL_CREAM | Freq: Two times a day (BID) | CUTANEOUS | 2 refills | Status: DC

## 2024-09-16 NOTE — Progress Notes (Signed)
 Pt could not receive ketoconazole  cream from pharmacy. They told her the quantity on the prescription was incorrect.   No concerns at this time.

## 2024-09-16 NOTE — Progress Notes (Signed)
   PRENATAL VISIT NOTE  Subjective:  Jasmine Decker is a 28 y.o. G3P2002 at [redacted]w[redacted]d being seen today for ongoing prenatal care.  She is currently monitored for the following issues for this high-risk pregnancy and has Abnormal Pap smear of cervix; Supervision of other normal pregnancy, antepartum; BMI 45.0-49.9, adult (HCC); Vaginal Pap smear with ASC-H; Obesity affecting pregnancy, antepartum; and LGA (large for gestational age) fetus affecting management of mother on their problem list.  Patient reports no complaints.  Contractions: Irritability. Vag. Bleeding: None.  Movement: Present. Denies leaking of fluid.   The following portions of the patient's history were reviewed and updated as appropriate: allergies, current medications, past family history, past medical history, past social history, past surgical history and problem list.   Objective:    Vitals:   09/16/24 1104  BP: 114/71  Pulse: 88  Weight: 264 lb (119.7 kg)    Fetal Status:  Fetal Heart Rate (bpm): 134   Movement: Present    General: Alert, oriented and cooperative. Patient is in no acute distress.  Skin: Skin is warm and dry. No rash noted.   Cardiovascular: Normal heart rate noted  Respiratory: Normal respiratory effort, no problems with respiration noted  Abdomen: Soft, gravid, appropriate for gestational age.  Pain/Pressure: Absent     Pelvic: Cervical exam deferred        Extremities: Normal range of motion.  Edema: None  Mental Status: Normal mood and affect. Normal behavior. Normal judgment and thought content.   Assessment and Plan:  Pregnancy: G3P2002 at [redacted]w[redacted]d  1. Supervision of other normal pregnancy, antepartum (Primary) Patient doing well, feeling regular fetal movement BP, FHR, FH appropriate  2. [redacted] weeks gestation of pregnancy Anticipatory guidance about next visits/weeks of pregnancy given.   3. Excessive fetal growth affecting management of pregnancy in third trimester, single or unspecified  fetus 4. BMI 45.0-49.9, adult (HCC) 9/12 EFW 1113 gm  (94 %), AFI WNL  10/17 growth scan  Weekly antenatal testing starting 34 weeks   5. Intertrigo Patient unable to get cream from pharmacy due to dosing. I called pharmacy twice today with no answer and placed another order. Suggested OTC clotrimazole with nystatin  powder in the meantime.  - ketoconazole  (NIZORAL ) 2 % cream; Apply 1 Application topically 2 (two) times daily.  Dispense: 60 g; Refill: 2   Preterm labor symptoms and general obstetric precautions including but not limited to vaginal bleeding, contractions, leaking of fluid and fetal movement were reviewed in detail with the patient.  Please refer to After Visit Summary for other counseling recommendations.   Return in about 2 weeks (around 09/30/2024) for [facility].  Future Appointments  Date Time Provider Department Center  09/23/2024  3:15 PM [facility] PROVIDER 1 [facility]  09/23/2024  3:30 PM [facility] US1 [facility]  09/29/2024 10:15 AM Rudy Carlin LABOR, MD [facility] None  10/14/2024 11:15 AM [facility] PROVIDER 1 [facility]  10/14/2024 11:30 AM [facility] US2 [facility]  10/21/2024 11:15 AM [facility] PROVIDER 1 [facility]  10/21/2024 11:30 AM [facility] US4 [facility]  10/28/2024 11:00 AM [facility] PROVIDER 1 [facility]  10/28/2024 11:30 AM [facility] US2 [facility]    Jorene FORBES Moats, PA-C

## 2024-09-23 ENCOUNTER — Ambulatory Visit: Attending: Obstetrics and Gynecology | Admitting: Obstetrics and Gynecology

## 2024-09-23 ENCOUNTER — Ambulatory Visit

## 2024-09-23 VITALS — BP 139/67 | HR 101

## 2024-09-23 DIAGNOSIS — O3663X0 Maternal care for excessive fetal growth, third trimester, not applicable or unspecified: Secondary | ICD-10-CM | POA: Diagnosis not present

## 2024-09-23 DIAGNOSIS — Z3A31 31 weeks gestation of pregnancy: Secondary | ICD-10-CM | POA: Diagnosis not present

## 2024-09-23 DIAGNOSIS — E669 Obesity, unspecified: Secondary | ICD-10-CM | POA: Diagnosis not present

## 2024-09-23 DIAGNOSIS — O99210 Obesity complicating pregnancy, unspecified trimester: Secondary | ICD-10-CM | POA: Diagnosis present

## 2024-09-23 DIAGNOSIS — O99213 Obesity complicating pregnancy, third trimester: Secondary | ICD-10-CM

## 2024-09-23 DIAGNOSIS — E66813 Obesity, class 3: Secondary | ICD-10-CM | POA: Insufficient documentation

## 2024-09-23 DIAGNOSIS — Z362 Encounter for other antenatal screening follow-up: Secondary | ICD-10-CM | POA: Insufficient documentation

## 2024-09-23 DIAGNOSIS — Z3A30 30 weeks gestation of pregnancy: Secondary | ICD-10-CM | POA: Diagnosis not present

## 2024-09-23 NOTE — Progress Notes (Signed)
 Maternal-Fetal Medicine Consultation  Name: Carolynn Tuley  MRN: [id number]  GA: [id number] [redacted]w[redacted]d   Patient is here for fetal growth assessment. She does not have gestational diabetes.  Blood pressure today at our office is 139/67 mmHg.  Ultrasound The estimated fetal weight is at the 95th percentile at the abdominal circumference measurement at the 99th percentile.  Normal amniotic fluid.  Cephalic presentation.  I discussed the limitations of ultrasound and accurately estimating fetal weights.  Her previous children weighed between 6 and 7 pounds at birth.  In the absence of gestational diabetes, this may be a constitutionally large fetus.  Pregravid BMI 46 Grade 3 obesity is independently associated with increased risk of stillbirth (2.5- to 3-fold), but the absolute risk is very small.  I discussed protocol of weekly antenatal testing from [redacted] weeks gestation until delivery. Obesity is associated with increased risk for gestational diabetes and gestational hypertension. Ultrasound has limitations in the resolution of ultrasound images and fetal anomalies may be missed.   I explained the components of antenatal testing (biophysical profile) and that if it reassuring, the risk of stillbirth is less than 1 per 1,000 births in one week.  Recommendations -We recommend weekly antenatal testing from [redacted] weeks gestation until delivery.     Consultation including face-to-face (more than 50%) counseling 20 minutes.

## 2024-09-29 ENCOUNTER — Encounter: Admitting: Obstetrics

## 2024-09-30 ENCOUNTER — Telehealth: Payer: Self-pay

## 2024-10-06 ENCOUNTER — Ambulatory Visit (INDEPENDENT_AMBULATORY_CARE_PROVIDER_SITE_OTHER): Admitting: Obstetrics and Gynecology

## 2024-10-06 VITALS — BP 110/73 | HR 91 | Wt 264.0 lb

## 2024-10-06 DIAGNOSIS — Z3480 Encounter for supervision of other normal pregnancy, unspecified trimester: Secondary | ICD-10-CM | POA: Diagnosis not present

## 2024-10-06 DIAGNOSIS — Z23 Encounter for immunization: Secondary | ICD-10-CM | POA: Diagnosis not present

## 2024-10-06 DIAGNOSIS — Z3A33 33 weeks gestation of pregnancy: Secondary | ICD-10-CM

## 2024-10-06 DIAGNOSIS — O99210 Obesity complicating pregnancy, unspecified trimester: Secondary | ICD-10-CM | POA: Diagnosis not present

## 2024-10-06 DIAGNOSIS — Z3A32 32 weeks gestation of pregnancy: Secondary | ICD-10-CM | POA: Diagnosis not present

## 2024-10-06 DIAGNOSIS — O3663X0 Maternal care for excessive fetal growth, third trimester, not applicable or unspecified: Secondary | ICD-10-CM

## 2024-10-06 NOTE — Progress Notes (Signed)
   PRENATAL VISIT NOTE  Subjective:  Jasmine Decker is a 28 y.o. G3P2002 at [redacted]w[redacted]d being seen today for ongoing prenatal care.  She is currently monitored for the following issues for this high-risk pregnancy and has Abnormal Pap smear of cervix; Supervision of other normal pregnancy, antepartum; BMI 45.0-49.9, adult (HCC); Vaginal Pap smear with ASC-H; Obesity affecting pregnancy, antepartum; and LGA (large for gestational age) fetus affecting management of mother on their problem list.  Patient reports no complaints.  Contractions: Irregular. Vag. Bleeding: None.  Movement: Present. Denies leaking of fluid.   The following portions of the patient's history were reviewed and updated as appropriate: allergies, current medications, past family history, past medical history, past social history, past surgical history and problem list.   Objective:   Vitals:   10/06/24 1346  BP: 110/73  Pulse: 91  Weight: 264 lb (119.7 kg)   Body mass index is 46.77 kg/m. Total weight gain: 4 lb (1.814 kg)   Fetal Status: Fetal Heart Rate (bpm): 140 Fundal Height: 34 cm Movement: Present     General:  Alert, oriented and cooperative. Patient is in no acute distress.  Skin: Skin is warm and dry. No rash noted.   Cardiovascular: Normal heart rate noted  Respiratory: Normal respiratory effort, no problems with respiration noted  Abdomen: Soft, gravid, appropriate for gestational age.  Pain/Pressure: Present     Pelvic: Cervical exam deferred        Extremities: Normal range of motion.     Mental Status: Normal mood and affect. Normal behavior. Normal judgment and thought content.   Assessment and Plan:  Pregnancy: G3P2002 at [redacted]w[redacted]d 1. Supervision of other normal pregnancy, antepartum (Primary) Tdap/Flu/RSV vaccines reviewed. Will do today.  2. Obesity affecting pregnancy, antepartum, unspecified obesity type Serial growth and antenatal testing scheduled  3. Excessive fetal growth affecting management  of pregnancy in third trimester, single or unspecified fetus Last growth 10/17 EFW 95%, AC 99% Fetal kick counts reviewed   Preterm labor symptoms and general obstetric precautions including but not limited to vaginal bleeding, contractions, leaking of fluid and fetal movement were reviewed in detail with the patient. Please refer to After Visit Summary for other counseling recommendations.   Return in about 2 weeks (around 10/20/2024).  Future Appointments  Date Time Provider Department Center  10/14/2024 11:15 AM [facility] PROVIDER 1 [facility]  10/14/2024 11:30 AM [facility] US2 [facility]  10/21/2024  3:15 PM [facility] PROVIDER 1 [facility]  10/21/2024  3:30 PM [facility] US2 [facility]  10/28/2024 11:00 AM [facility] PROVIDER 1 [facility]  10/28/2024 11:30 AM [facility] US2 [facility]    Rollo ONEIDA Bring, MD

## 2024-10-06 NOTE — Addendum Note (Signed)
 Addended by: JERRYE AREA D on: 10/06/2024 02:20 PM   Modules accepted: Orders

## 2024-10-14 ENCOUNTER — Ambulatory Visit: Attending: Obstetrics | Admitting: Obstetrics

## 2024-10-14 ENCOUNTER — Ambulatory Visit

## 2024-10-14 ENCOUNTER — Other Ambulatory Visit: Payer: Self-pay | Admitting: *Deleted

## 2024-10-14 VITALS — BP 125/64 | HR 86

## 2024-10-14 DIAGNOSIS — O99213 Obesity complicating pregnancy, third trimester: Secondary | ICD-10-CM

## 2024-10-14 DIAGNOSIS — Z3A34 34 weeks gestation of pregnancy: Secondary | ICD-10-CM | POA: Diagnosis not present

## 2024-10-14 DIAGNOSIS — E669 Obesity, unspecified: Secondary | ICD-10-CM

## 2024-10-14 DIAGNOSIS — O99210 Obesity complicating pregnancy, unspecified trimester: Secondary | ICD-10-CM

## 2024-10-14 DIAGNOSIS — O3663X0 Maternal care for excessive fetal growth, third trimester, not applicable or unspecified: Secondary | ICD-10-CM

## 2024-10-14 DIAGNOSIS — Z362 Encounter for other antenatal screening follow-up: Secondary | ICD-10-CM | POA: Insufficient documentation

## 2024-10-14 NOTE — Progress Notes (Signed)
 MFM Consult Note  Jasmine Decker is currently at 34weeks and 1 day.  She has been followed due to maternal obesity with a BMI of 46.  She denies any problems since her last exam and has screened negative for gestational diabetes.  Sonographic findings Single intrauterine pregnancy at 34w 1d.  Fetal cardiac activity:  Observed and appears normal. Presentation: Cephalic. Fetal biometry shows the estimated fetal weigh of 6 pounds 11 ounces which measures at the 98th percentile. Amniotic fluid volume: Within normal limits.  AFI: 16.18 cm.  MVP: 4.95 cm. Placenta: Posterior. BPP: 8/8.  Due to maternal obesity, we will continue to follow her with weekly fetal testing until delivery.    Due to maternal obesity and a large for gestational age fetus, delivery should be considered at around 39 weeks.  She will return in 1 week for another BPP.  The patient stated that all of her questions were answered today.  A total of 20 minutes was spent counseling and coordinating the care for this patient.  Greater than 50% of the time was spent in direct face-to-face contact.

## 2024-10-20 ENCOUNTER — Ambulatory Visit: Admitting: Obstetrics

## 2024-10-20 ENCOUNTER — Encounter: Payer: Self-pay | Admitting: Obstetrics

## 2024-10-20 VITALS — BP 114/70 | HR 85 | Wt 266.2 lb

## 2024-10-20 DIAGNOSIS — O3663X0 Maternal care for excessive fetal growth, third trimester, not applicable or unspecified: Secondary | ICD-10-CM

## 2024-10-20 DIAGNOSIS — Z3480 Encounter for supervision of other normal pregnancy, unspecified trimester: Secondary | ICD-10-CM

## 2024-10-20 DIAGNOSIS — O99210 Obesity complicating pregnancy, unspecified trimester: Secondary | ICD-10-CM

## 2024-10-20 DIAGNOSIS — O99213 Obesity complicating pregnancy, third trimester: Secondary | ICD-10-CM

## 2024-10-20 DIAGNOSIS — O3660X0 Maternal care for excessive fetal growth, unspecified trimester, not applicable or unspecified: Secondary | ICD-10-CM

## 2024-10-20 DIAGNOSIS — Z3A35 35 weeks gestation of pregnancy: Secondary | ICD-10-CM | POA: Diagnosis not present

## 2024-10-20 NOTE — Progress Notes (Signed)
 Subjective:  Jasmine Decker is a 28 y.o. G3P2002 at [redacted]w[redacted]d being seen today for ongoing prenatal care.  She is currently monitored for the following issues for this low-risk pregnancy and has Abnormal Pap smear of cervix; Supervision of other normal pregnancy, antepartum; BMI 45.0-49.9, adult (HCC); Vaginal Pap smear with ASC-H; Obesity affecting pregnancy, antepartum; and LGA (large for gestational age) fetus affecting management of mother on their problem list.  Patient reports no complaints.  Contractions: Not present. Vag. Bleeding: None.  Movement: Present. Denies leaking of fluid.   The following portions of the patient's history were reviewed and updated as appropriate: allergies, current medications, past family history, past medical history, past social history, past surgical history and problem list. Problem list updated.  Objective:   Vitals:   10/20/24 1121  BP: 114/70  Pulse: 85  Weight: 266 lb 3.2 oz (120.7 kg)    Fetal Status: Fetal Heart Rate (bpm): 153   Movement: Present     General:  Alert, oriented and cooperative. Patient is in no acute distress.  Skin: Skin is warm and dry. No rash noted.   Cardiovascular: Normal heart rate noted  Respiratory: Normal respiratory effort, no problems with respiration noted  Abdomen: Soft, gravid, appropriate for gestational age. Pain/Pressure: Present     Pelvic:  Cervical exam deferred        Extremities: Normal range of motion.  Edema: None  Mental Status: Normal mood and affect. Normal behavior. Normal judgment and thought content.   Urinalysis:      Assessment and Plan:  Pregnancy: G3P2002 at [redacted]w[redacted]d  1. Supervision of other normal pregnancy, antepartum (Primary)  2. Excessive fetal growth affecting management of pregnancy, antepartum, single or unspecified fetus  3. Obesity affecting pregnancy, antepartum, unspecified obesity type    There are no diagnoses linked to this encounter. Preterm labor symptoms and general  obstetric precautions including but not limited to vaginal bleeding, contractions, leaking of fluid and fetal movement were reviewed in detail with the patient. Please refer to After Visit Summary for other counseling recommendations.   Return in about 2 weeks (around 11/03/2024) for ROB.   Rudy Carlin LABOR, MD 10/20/2024

## 2024-10-20 NOTE — Progress Notes (Signed)
 Pt presents for rob. Pt has no questions or concerns at this time.

## 2024-10-21 ENCOUNTER — Ambulatory Visit

## 2024-10-21 ENCOUNTER — Other Ambulatory Visit

## 2024-10-21 ENCOUNTER — Ambulatory Visit: Attending: Obstetrics | Admitting: Obstetrics

## 2024-10-21 VITALS — BP 116/62 | HR 77

## 2024-10-21 DIAGNOSIS — O99213 Obesity complicating pregnancy, third trimester: Secondary | ICD-10-CM | POA: Diagnosis not present

## 2024-10-21 DIAGNOSIS — Z3A35 35 weeks gestation of pregnancy: Secondary | ICD-10-CM | POA: Insufficient documentation

## 2024-10-21 DIAGNOSIS — E669 Obesity, unspecified: Secondary | ICD-10-CM

## 2024-10-21 DIAGNOSIS — O3663X0 Maternal care for excessive fetal growth, third trimester, not applicable or unspecified: Secondary | ICD-10-CM

## 2024-10-21 DIAGNOSIS — O99210 Obesity complicating pregnancy, unspecified trimester: Secondary | ICD-10-CM | POA: Diagnosis present

## 2024-10-21 DIAGNOSIS — Z362 Encounter for other antenatal screening follow-up: Secondary | ICD-10-CM | POA: Diagnosis not present

## 2024-10-21 NOTE — Progress Notes (Signed)
 MFM Consult Note  Gwenette Szymanowski is currently at 35 weeks and 1 day.  She has been followed due to maternal obesity with a BMI of 46.  She denies any problems since her last exam and has screened negative for gestational diabetes.  Sonographic findings Single intrauterine pregnancy at 34w 1d.  Fetal cardiac activity:  Observed and appears normal. Presentation: Cephalic. Amniotic fluid volume: Within normal limits.  AFI: 11.98 cm.  MVP: 4.3 cm. Placenta: Posterior. BPP: 8/8.  Due to maternal obesity, we will continue to follow her with weekly fetal testing until delivery.    Due to maternal obesity and a large for gestational age fetus, delivery should be considered at around 39 weeks.  She will return in 1 week for another BPP.    We will reassess the fetal weight in 3 weeks prior to delivery.  The patient stated that all of her questions were answered today.  A total of 10 minutes was spent counseling and coordinating the care for this patient.  Greater than 50% of the time was spent in direct face-to-face contact.

## 2024-10-24 ENCOUNTER — Other Ambulatory Visit: Payer: Self-pay | Admitting: *Deleted

## 2024-10-24 DIAGNOSIS — O99213 Obesity complicating pregnancy, third trimester: Secondary | ICD-10-CM

## 2024-10-24 DIAGNOSIS — O3663X0 Maternal care for excessive fetal growth, third trimester, not applicable or unspecified: Secondary | ICD-10-CM

## 2024-10-27 ENCOUNTER — Encounter: Payer: Self-pay | Admitting: Obstetrics and Gynecology

## 2024-10-27 ENCOUNTER — Other Ambulatory Visit (HOSPITAL_COMMUNITY)
Admission: RE | Admit: 2024-10-27 | Discharge: 2024-10-27 | Disposition: A | Discharge status: Home / Self Care | Source: Ambulatory Visit | Attending: Obstetrics and Gynecology | Admitting: Obstetrics and Gynecology

## 2024-10-27 ENCOUNTER — Ambulatory Visit (INDEPENDENT_AMBULATORY_CARE_PROVIDER_SITE_OTHER): Admitting: Obstetrics and Gynecology

## 2024-10-27 VITALS — BP 109/73 | HR 78 | Wt 267.0 lb

## 2024-10-27 DIAGNOSIS — Z3480 Encounter for supervision of other normal pregnancy, unspecified trimester: Secondary | ICD-10-CM

## 2024-10-27 DIAGNOSIS — Z3A36 36 weeks gestation of pregnancy: Secondary | ICD-10-CM | POA: Insufficient documentation

## 2024-10-27 DIAGNOSIS — O3663X0 Maternal care for excessive fetal growth, third trimester, not applicable or unspecified: Secondary | ICD-10-CM

## 2024-10-27 DIAGNOSIS — O99210 Obesity complicating pregnancy, unspecified trimester: Secondary | ICD-10-CM

## 2024-10-27 NOTE — Progress Notes (Signed)
 PRENATAL VISIT NOTE  Subjective:  Jasmine Decker is a 28 y.o. G3P2002 at [redacted]w[redacted]d being seen today for ongoing prenatal care.  She is currently monitored for the following issues for this high-risk pregnancy and has Abnormal Pap smear of cervix; Supervision of other normal pregnancy, antepartum; BMI 45.0-49.9, adult (HCC); Vaginal Pap smear with ASC-H; Obesity affecting pregnancy, antepartum; and LGA (large for gestational age) fetus affecting management of mother on their problem list.  Patient reports no complaints.  Contractions: Irritability. Vag. Bleeding: None.  Movement: Present. Denies leaking of fluid.   The following portions of the patient's history were reviewed and updated as appropriate: allergies, current medications, past family history, past medical history, past social history, past surgical history and problem list.   Objective:   Vitals:   10/27/24 1057  BP: 109/73  Pulse: 78  Weight: 267 lb (121.1 kg)    Fetal Status:  Fetal Heart Rate (bpm): + on us    Movement: Present    General: Alert, oriented and cooperative. Patient is in no acute distress.  Skin: Skin is warm and dry. No rash noted.   Cardiovascular: Normal heart rate noted  Respiratory: Normal respiratory effort, no problems with respiration noted  Abdomen: Soft, gravid, appropriate for gestational age.  Pain/Pressure: Present     Pelvic: Cervical exam performed in the presence of a chaperone Dilation: 1 Effacement (%): Thick Station: Ballotable  Extremities: Normal range of motion.  Edema: None  Mental Status: Normal mood and affect. Normal behavior. Normal judgment and thought content.      09/16/2024   11:18 AM 05/12/2024    1:33 PM 04/26/2024    1:58 PM  Depression screen PHQ 2/9  Decreased Interest 0 0 0  Down, Depressed, Hopeless 0 0 0  PHQ - 2 Score 0 0 0  Altered sleeping 0 0 0  Tired, decreased energy 0 0 3  Change in appetite 0 0 0  Feeling bad or failure about yourself  0 0 0  Trouble  concentrating 0 0 0  Moving slowly or fidgety/restless 0 0 0  Suicidal thoughts 0 0 0  PHQ-9 Score 0  0  3      Data saved with a previous flowsheet row definition        09/16/2024   11:19 AM 05/12/2024    1:33 PM 04/26/2024    1:58 PM 12/26/2021   10:16 AM  GAD 7 : Generalized Anxiety Score  Nervous, Anxious, on Edge 0 0 0 0  Control/stop worrying 0 0 0 0  Worry too much - different things 0 0 0 0  Trouble relaxing 0 0 0 0  Restless 0 0 0 0  Easily annoyed or irritable 0 0 0 0  Afraid - awful might happen 0 0 0 0  Total GAD 7 Score 0 0 0 0    Assessment and Plan:  Pregnancy: G3P2002 at [redacted]w[redacted]d 1. Supervision of other normal pregnancy, antepartum (Primary) Patient is doing well without complaints Cultures today Patient plans vasectomy  2. [redacted] weeks gestation of pregnancy   3. Excessive fetal growth affecting management of pregnancy in third trimester, single or unspecified fetus Continue antenatal testing Will plan for IOL 39 weeks- to be scheduled  4. Obesity affecting pregnancy, antepartum, unspecified obesity type Continue ASA  Preterm labor symptoms and general obstetric precautions including but not limited to vaginal bleeding, contractions, leaking of fluid and fetal movement were reviewed in detail with the patient. Please refer to After Visit Summary  for other counseling recommendations.   Return in about 1 week (around 11/03/2024) for in person, ROB, High risk.  Future Appointments  Date Time Provider Department Center  10/28/2024  3:00 PM [facility] NURSE [facility]  10/28/2024  3:15 PM [facility] NST [facility]    Winton Felt, MD

## 2024-10-27 NOTE — Progress Notes (Signed)
ROB GBS 

## 2024-10-28 ENCOUNTER — Ambulatory Visit (HOSPITAL_BASED_OUTPATIENT_CLINIC_OR_DEPARTMENT_OTHER): Admitting: *Deleted

## 2024-10-28 ENCOUNTER — Other Ambulatory Visit: Payer: Self-pay | Admitting: *Deleted

## 2024-10-28 ENCOUNTER — Ambulatory Visit: Attending: Obstetrics and Gynecology | Admitting: *Deleted

## 2024-10-28 ENCOUNTER — Ambulatory Visit

## 2024-10-28 VITALS — BP 131/57 | HR 90

## 2024-10-28 DIAGNOSIS — O99213 Obesity complicating pregnancy, third trimester: Secondary | ICD-10-CM

## 2024-10-28 DIAGNOSIS — O3663X0 Maternal care for excessive fetal growth, third trimester, not applicable or unspecified: Secondary | ICD-10-CM | POA: Insufficient documentation

## 2024-10-28 DIAGNOSIS — Z3A36 36 weeks gestation of pregnancy: Secondary | ICD-10-CM | POA: Insufficient documentation

## 2024-10-28 DIAGNOSIS — O99210 Obesity complicating pregnancy, unspecified trimester: Secondary | ICD-10-CM

## 2024-10-28 LAB — CERVICOVAGINAL ANCILLARY ONLY
Chlamydia: NEGATIVE
Comment: NEGATIVE
Comment: NORMAL
Neisseria Gonorrhea: NEGATIVE

## 2024-10-28 NOTE — Procedures (Signed)
 Jasmine Decker December 01, 1996 [redacted]w[redacted]d  Fetus A Non-Stress Test Interpretation for 10/28/24-NST only  Indication: obese, LGA  Fetal Heart Rate A Mode: External Baseline Rate (A): 140 bpm Variability: Moderate Accelerations: 15 x 15 Decelerations: None Multiple birth?: No  Uterine Activity Mode: Toco Contraction Frequency (min): none Resting Tone Palpated: Relaxed  Interpretation (Fetal Testing) Nonstress Test Interpretation: Reactive Comments: Tracing reviewed byDr. Ileana

## 2024-10-31 LAB — CULTURE, BETA STREP (GROUP B ONLY): Strep Gp B Culture: NEGATIVE

## 2024-11-02 ENCOUNTER — Encounter: Payer: Self-pay | Admitting: Obstetrics and Gynecology

## 2024-11-02 ENCOUNTER — Ambulatory Visit: Admitting: Obstetrics and Gynecology

## 2024-11-02 VITALS — BP 116/76 | HR 99 | Wt 266.7 lb

## 2024-11-02 DIAGNOSIS — O99210 Obesity complicating pregnancy, unspecified trimester: Secondary | ICD-10-CM

## 2024-11-02 DIAGNOSIS — O3663X0 Maternal care for excessive fetal growth, third trimester, not applicable or unspecified: Secondary | ICD-10-CM

## 2024-11-02 DIAGNOSIS — Z3480 Encounter for supervision of other normal pregnancy, unspecified trimester: Secondary | ICD-10-CM

## 2024-11-02 NOTE — Progress Notes (Signed)
 PRENATAL VISIT NOTE  Subjective:  Jasmine Decker is a 28 y.o. G3P2002 at [redacted]w[redacted]d being seen today for ongoing prenatal care.  She is currently monitored for the following issues for this high-risk pregnancy and has Abnormal Pap smear of cervix; Supervision of other normal pregnancy, antepartum; BMI 45.0-49.9, adult (HCC); Vaginal Pap smear with ASC-H; Obesity affecting pregnancy, antepartum; and LGA (large for gestational age) fetus affecting management of mother on their problem list.  Patient reports no complaints.  Contractions: Irritability. Vag. Bleeding: None.  Movement: Absent. Denies leaking of fluid.   The following portions of the patient's history were reviewed and updated as appropriate: allergies, current medications, past family history, past medical history, past social history, past surgical history and problem list.   Objective:   Vitals:   11/02/24 1041  BP: 116/76  Pulse: 99  Weight: 266 lb 11.2 oz (121 kg)    Fetal Status:  Fetal Heart Rate (bpm): 155   Movement: Absent    General: Alert, oriented and cooperative. Patient is in no acute distress.  Skin: Skin is warm and dry. No rash noted.   Cardiovascular: Normal heart rate noted  Respiratory: Normal respiratory effort, no problems with respiration noted  Abdomen: Soft, gravid, appropriate for gestational age.  Pain/Pressure: Present     Pelvic: Cervical exam deferred        Extremities: Normal range of motion.  Edema: None  Mental Status: Normal mood and affect. Normal behavior. Normal judgment and thought content.      09/16/2024   11:18 AM 05/12/2024    1:33 PM 04/26/2024    1:58 PM  Depression screen PHQ 2/9  Decreased Interest 0 0 0  Down, Depressed, Hopeless 0 0 0  PHQ - 2 Score 0 0 0  Altered sleeping 0 0 0  Tired, decreased energy 0 0 3  Change in appetite 0 0 0  Feeling bad or failure about yourself  0 0 0  Trouble concentrating 0 0 0  Moving slowly or fidgety/restless 0 0 0  Suicidal thoughts 0  0 0  PHQ-9 Score 0  0  3      Data saved with a previous flowsheet row definition        09/16/2024   11:19 AM 05/12/2024    1:33 PM 04/26/2024    1:58 PM 12/26/2021   10:16 AM  GAD 7 : Generalized Anxiety Score  Nervous, Anxious, on Edge 0 0 0 0  Control/stop worrying 0 0 0 0  Worry too much - different things 0 0 0 0  Trouble relaxing 0 0 0 0  Restless 0 0 0 0  Easily annoyed or irritable 0 0 0 0  Afraid - awful might happen 0 0 0 0  Total GAD 7 Score 0 0 0 0    Assessment and Plan:  Pregnancy: G3P2002 at [redacted]w[redacted]d 1. Supervision of other normal pregnancy, antepartum (Primary) Patient is doing well without complaints  2. Excessive fetal growth affecting management of pregnancy in third trimester, single or unspecified fetus EFW 3038 gm 98%tile on 11/7 Per MFM plan for IOL at 39 weeks Continue weekly NST- baseline 145, mod variability, +accels, no decels  3. Obesity affecting pregnancy, antepartum, unspecified obesity type   Preterm labor symptoms and general obstetric precautions including but not limited to vaginal bleeding, contractions, leaking of fluid and fetal movement were reviewed in detail with the patient. Please refer to After Visit Summary for other counseling recommendations.   No follow-ups on  file.  No future appointments.  Winton Felt, MD

## 2024-11-02 NOTE — Progress Notes (Signed)
 HROB 36.6 LGA, Weekly NST per MFM note 10/21/25 No unusual complaints

## 2024-11-10 ENCOUNTER — Ambulatory Visit: Admitting: Obstetrics and Gynecology

## 2024-11-10 ENCOUNTER — Telehealth (HOSPITAL_COMMUNITY): Payer: Self-pay | Admitting: *Deleted

## 2024-11-10 VITALS — BP 115/74 | HR 80 | Wt 273.9 lb

## 2024-11-10 DIAGNOSIS — Z3A38 38 weeks gestation of pregnancy: Secondary | ICD-10-CM | POA: Diagnosis not present

## 2024-11-10 DIAGNOSIS — Z3480 Encounter for supervision of other normal pregnancy, unspecified trimester: Secondary | ICD-10-CM

## 2024-11-10 DIAGNOSIS — Z6841 Body Mass Index (BMI) 40.0 and over, adult: Secondary | ICD-10-CM

## 2024-11-10 DIAGNOSIS — O3663X0 Maternal care for excessive fetal growth, third trimester, not applicable or unspecified: Secondary | ICD-10-CM

## 2024-11-10 DIAGNOSIS — R87619 Unspecified abnormal cytological findings in specimens from cervix uteri: Secondary | ICD-10-CM | POA: Diagnosis not present

## 2024-11-10 NOTE — Progress Notes (Signed)
   PRENATAL VISIT NOTE  Subjective:  Jasmine Decker is a 28 y.o. G3P2002 at [redacted]w[redacted]d being seen today for ongoing prenatal care.  She is currently monitored for the following issues for this high-risk pregnancy and has Abnormal Pap smear of cervix; Supervision of other normal pregnancy, antepartum; BMI 45.0-49.9, adult (HCC); Obesity affecting pregnancy, antepartum; and LGA (large for gestational age) fetus affecting management of mother on their problem list.  Patient reports more pelvic pressure.  Contractions: Irregular. Vag. Bleeding: None.  Movement: Present. Denies leaking of fluid.   The following portions of the patient's history were reviewed and updated as appropriate: allergies, current medications, past family history, past medical history, past social history, past surgical history and problem list.   Objective:   Vitals:   11/10/24 1327  BP: 115/74  Pulse: 80  Weight: 273 lb 14.4 oz (124.2 kg)    Fetal Status: Fetal Heart Rate (bpm): 138   Movement: Present     General:  Alert, oriented and cooperative. Patient is in no acute distress.  Skin: Skin is warm and dry. No rash noted.   Cardiovascular: Normal heart rate noted  Respiratory: Normal respiratory effort, no problems with respiration noted  Abdomen: Soft, gravid, appropriate for gestational age.  Pain/Pressure: Absent     SCE 1/long/high - performed in presence of chaperone BSUS confirmed vertex  Assessment and Plan:  Pregnancy: G3P2002 at [redacted]w[redacted]d 1. Supervision of other normal pregnancy, antepartum (Primary) 2. [redacted] weeks gestation of pregnancy GBS neg Vtx by US   3. BMI 45.0-49.9, adult (HCC) 4. Excessive fetal growth affecting management of pregnancy in third trimester, single or unspecified fetus @[redacted]w[redacted]d  3038g (98%), AC >99%, ceph, posterior Weekly NST -reactive & reassuring today IOL scheduled 12/11  5. Abnormal cervical Papanicolaou smear, unspecified abnormal pap finding 06/05/21 LSIL/HPV+ 07/18/22  ASCUS/HPV+ 05/12/24 ASC-H/HPV+ [ ]  PP colpo - emphasized importance of colposcopy postpartum, limited utility today as she is delivering in 1 week  Please refer to After Visit Summary for other counseling recommendations.   Future Appointments  Date Time Provider Department Center  11/17/2024  6:30 AM [facility] SCHED ROOM [facility] None   Kieth JAYSON Carolin, MD

## 2024-11-10 NOTE — Telephone Encounter (Signed)
 Preadmission screen

## 2024-11-10 NOTE — Progress Notes (Signed)
 Pt requesting cervical check today. Feeling increased pressure vaginally last few days.

## 2024-11-11 ENCOUNTER — Encounter (HOSPITAL_COMMUNITY): Payer: Self-pay | Admitting: *Deleted

## 2024-11-11 ENCOUNTER — Telehealth (HOSPITAL_COMMUNITY): Payer: Self-pay | Admitting: *Deleted

## 2024-11-11 NOTE — Telephone Encounter (Signed)
 Preadmission screen

## 2024-11-17 ENCOUNTER — Encounter (HOSPITAL_COMMUNITY): Payer: Self-pay | Admitting: Family Medicine

## 2024-11-17 ENCOUNTER — Inpatient Hospital Stay (HOSPITAL_COMMUNITY)
Admission: RE | Admit: 2024-11-17 | Discharge: 2024-11-19 | DRG: 807 | Disposition: A | Attending: Family Medicine | Admitting: Family Medicine

## 2024-11-17 ENCOUNTER — Other Ambulatory Visit: Payer: Self-pay

## 2024-11-17 ENCOUNTER — Inpatient Hospital Stay (HOSPITAL_COMMUNITY)

## 2024-11-17 DIAGNOSIS — O3663X0 Maternal care for excessive fetal growth, third trimester, not applicable or unspecified: Secondary | ICD-10-CM | POA: Diagnosis not present

## 2024-11-17 DIAGNOSIS — Z833 Family history of diabetes mellitus: Secondary | ICD-10-CM

## 2024-11-17 DIAGNOSIS — D62 Acute posthemorrhagic anemia: Secondary | ICD-10-CM | POA: Diagnosis not present

## 2024-11-17 DIAGNOSIS — O99214 Obesity complicating childbirth: Secondary | ICD-10-CM | POA: Diagnosis present

## 2024-11-17 DIAGNOSIS — O9081 Anemia of the puerperium: Secondary | ICD-10-CM | POA: Diagnosis not present

## 2024-11-17 DIAGNOSIS — R87619 Unspecified abnormal cytological findings in specimens from cervix uteri: Secondary | ICD-10-CM | POA: Diagnosis present

## 2024-11-17 DIAGNOSIS — Z6841 Body Mass Index (BMI) 40.0 and over, adult: Secondary | ICD-10-CM

## 2024-11-17 DIAGNOSIS — Z3480 Encounter for supervision of other normal pregnancy, unspecified trimester: Secondary | ICD-10-CM

## 2024-11-17 DIAGNOSIS — Z3A39 39 weeks gestation of pregnancy: Secondary | ICD-10-CM

## 2024-11-17 LAB — CBC
HCT: 32.9 % — ABNORMAL LOW (ref 36.0–46.0)
Hemoglobin: 10.8 g/dL — ABNORMAL LOW (ref 12.0–15.0)
MCH: 27.1 pg (ref 26.0–34.0)
MCHC: 32.8 g/dL (ref 30.0–36.0)
MCV: 82.7 fL (ref 80.0–100.0)
Platelets: 354 K/uL (ref 150–400)
RBC: 3.98 MIL/uL (ref 3.87–5.11)
RDW: 13.9 % (ref 11.5–15.5)
WBC: 10.8 K/uL — ABNORMAL HIGH (ref 4.0–10.5)
nRBC: 0 % (ref 0.0–0.2)

## 2024-11-17 LAB — TYPE AND SCREEN
ABO/RH(D): A POS
Antibody Screen: NEGATIVE

## 2024-11-17 MED ORDER — SODIUM CHLORIDE 0.9% FLUSH: 3.0000 mL | Freq: Two times a day (BID) | INTRAVENOUS | Status: DC

## 2024-11-17 MED ORDER — LIDOCAINE HCL (PF) 1 % IJ SOLN: 30.0000 mL | INTRAMUSCULAR | Status: DC | PRN

## 2024-11-17 MED ORDER — OXYTOCIN-SODIUM CHLORIDE 30-0.9 UT/500ML-% IV SOLN: 2.5000 [IU]/h | INTRAVENOUS | Status: DC

## 2024-11-17 MED ORDER — FENTANYL CITRATE (PF) 100 MCG/2ML IJ SOLN
50.0000 ug | INTRAMUSCULAR | Status: DC | PRN
  Filled 2024-11-17: qty 2

## 2024-11-17 MED ORDER — MISOPROSTOL 25 MCG QUARTER TABLET
25.0000 ug | ORAL_TABLET | ORAL | Status: DC | PRN
  Administered 2024-11-17 (×3): 25 ug via VAGINAL
  Filled 2024-11-17 (×3): qty 1

## 2024-11-17 MED ORDER — SODIUM CHLORIDE 0.9 % IV SOLN: 250.0000 mL | INTRAVENOUS | Status: DC | PRN

## 2024-11-17 MED ORDER — SOD CITRATE-CITRIC ACID 500-334 MG/5ML PO SOLN: 30.0000 mL | ORAL | Status: DC | PRN

## 2024-11-17 MED ORDER — ONDANSETRON HCL 4 MG/2ML IJ SOLN
4.0000 mg | Freq: Four times a day (QID) | INTRAMUSCULAR | Status: DC | PRN
  Administered 2024-11-18: 4 mg via INTRAVENOUS
  Filled 2024-11-17: qty 2

## 2024-11-17 MED ORDER — TERBUTALINE SULFATE 1 MG/ML IJ SOLN: 0.2500 mg | Freq: Once | INTRAMUSCULAR | Status: DC | PRN

## 2024-11-17 MED ORDER — OXYTOCIN BOLUS FROM INFUSION
333.0000 mL | Freq: Once | INTRAVENOUS | Status: AC
  Administered 2024-11-18: 333 mL via INTRAVENOUS

## 2024-11-17 MED ORDER — SODIUM CHLORIDE 0.9% FLUSH: 3.0000 mL | INTRAVENOUS | Status: DC | PRN

## 2024-11-17 MED ORDER — LACTATED RINGERS IV SOLN
500.0000 mL | INTRAVENOUS | Status: DC | PRN
  Administered 2024-11-18: 1000 mL via INTRAVENOUS

## 2024-11-17 MED ORDER — ACETAMINOPHEN 325 MG PO TABS: 650.0000 mg | ORAL_TABLET | ORAL | Status: DC | PRN

## 2024-11-17 NOTE — H&P (Signed)
 Jasmine Decker is a 27 y.o. female presenting for IOL for LGA with EFW 98%tile with AC >99th%tile. Largest baby 3447g. GDM screening was negative. Pregnancy complicated by BMI of 45.   OB History     Gravida  3   Para  2   Term  2   Preterm  0   AB  0   Living  2      SAB  0   IAB  0   Ectopic  0   Multiple  0   Live Births  2          Past Medical History:  Diagnosis Date   Gall stones    Medical history non-contributory    Morbid obesity (HCC)    Past Surgical History:  Procedure Laterality Date   CHOLECYSTECTOMY N/A 01/13/2015   Procedure: LAPAROSCOPIC CHOLECYSTECTOMY WITH POSSIBLE INTRAOPERATIVE CHOLANGIOGRAM;  Surgeon: Vicenta Poli, MD;  Location: [facility] OR;  Service: General;  Laterality: N/A;   Family History: family history includes Diabetes in her maternal grandmother; Gallbladder disease in her maternal aunt; Healthy in her father and mother. Social History:  reports that she has never smoked. She has been exposed to tobacco smoke. She has never used smokeless tobacco. She reports that she does not drink alcohol and does not use drugs.     Maternal Diabetes: No Genetic Screening: Normal Maternal Ultrasounds/Referrals: Normal Fetal Ultrasounds or other Referrals:  None Maternal Substance Abuse:  No Significant Maternal Medications:  None Significant Maternal Lab Results:  Group B Strep negative Number of Prenatal Visits:greater than 3 verified prenatal visits Maternal Vaccinations:RSV: Given during pregnancy >/=14 days ago, TDap, and Flu Other Comments:  None  Review of Systems History   Height 5' 3 (1.6 m), weight 122.4 kg, last menstrual period 02/08/2024. Exam Physical Exam Vitals reviewed.  Constitutional:      Appearance: Normal appearance.  HENT:     Head: Normocephalic and atraumatic.  Pulmonary:     Effort: Pulmonary effort is normal.  Skin:    Capillary Refill: Capillary refill takes less than 2 seconds.  Neurological:      Mental Status: She is alert.  Psychiatric:        Mood and Affect: Mood normal.        Behavior: Behavior normal.        Thought Content: Thought content normal.        Judgment: Judgment normal.       Prenatal labs: ABO, Rh: A/Positive/-- (06/05 1410) Antibody: Negative (06/05 1410) Rubella: 1.57 (06/05 1410) RPR: Non Reactive (09/26 0816)  HBsAg: Negative (06/05 1410)  HIV: Non Reactive (09/26 0816)  GBS: Negative/-- (11/20 1159)   Assessment/Plan: Cytotec  Plans on her husband getting a vasectomy Bottle feeding.   Jasmine Decker 11/17/2024, 2:13 PM

## 2024-11-17 NOTE — Anesthesia Preprocedure Evaluation (Signed)
 Anesthesia Evaluation  Patient identified by MRN, date of birth, ID band Patient awake    Reviewed: Allergy & Precautions, NPO status , Patient's Chart, lab work & pertinent test results  Airway Mallampati: II  TM Distance: >3 FB Neck ROM: Full    Dental no notable dental hx. (+) Teeth Intact, Dental Advisory Given   Pulmonary neg pulmonary ROS   Pulmonary exam normal breath sounds clear to auscultation       Cardiovascular negative cardio ROS Normal cardiovascular exam Rhythm:Regular Rate:Normal     Neuro/Psych negative neurological ROS  negative psych ROS   GI/Hepatic negative GI ROS, Neg liver ROS,,,  Endo/Other    Renal/GU negative Renal ROS     Musculoskeletal   Abdominal  (+) + obese  Peds  Hematology Lab Results      Component                Value               Date                      WBC                      10.8 (H)            11/17/2024                HGB                      10.8 (L)            11/17/2024                HCT                      32.9 (L)            11/17/2024                MCV                      82.7                11/17/2024                PLT                      354                 11/17/2024              Anesthesia Other Findings   Reproductive/Obstetrics (+) Pregnancy                              Anesthesia Physical Anesthesia Plan  ASA: 3  Anesthesia Plan: Epidural   Post-op Pain Management:    Induction:   PONV Risk Score and Plan:   Airway Management Planned:   Additional Equipment:   Intra-op Plan:   Post-operative Plan:   Informed Consent: I have reviewed the patients History and Physical, chart, labs and discussed the procedure including the risks, benefits and alternatives for the proposed anesthesia with the patient or authorized representative who has indicated his/her understanding and acceptance.       Plan Discussed  with:   Anesthesia Plan Comments: (39.1 wk G3P2 w BMI of 47.8 for  LEA)         Anesthesia Quick Evaluation

## 2024-11-18 ENCOUNTER — Encounter (HOSPITAL_COMMUNITY): Payer: Self-pay | Admitting: Obstetrics and Gynecology

## 2024-11-18 ENCOUNTER — Inpatient Hospital Stay (HOSPITAL_COMMUNITY): Admitting: Anesthesiology

## 2024-11-18 DIAGNOSIS — O3663X0 Maternal care for excessive fetal growth, third trimester, not applicable or unspecified: Secondary | ICD-10-CM

## 2024-11-18 DIAGNOSIS — Z3A39 39 weeks gestation of pregnancy: Secondary | ICD-10-CM

## 2024-11-18 LAB — SYPHILIS: RPR W/REFLEX TO RPR TITER AND TREPONEMAL ANTIBODIES, TRADITIONAL SCREENING AND DIAGNOSIS ALGORITHM: RPR Ser Ql: NONREACTIVE

## 2024-11-18 MED ORDER — DIPHENHYDRAMINE HCL 25 MG PO CAPS: 25.0000 mg | ORAL_CAPSULE | Freq: Four times a day (QID) | ORAL | Status: DC | PRN

## 2024-11-18 MED ORDER — COCONUT OIL OIL: 1.0000 | TOPICAL_OIL | Status: DC | PRN

## 2024-11-18 MED ORDER — ONDANSETRON HCL 4 MG/2ML IJ SOLN: 4.0000 mg | INTRAMUSCULAR | Status: DC | PRN

## 2024-11-18 MED ORDER — SENNOSIDES-DOCUSATE SODIUM 8.6-50 MG PO TABS
2.0000 | ORAL_TABLET | Freq: Every day | ORAL | Status: DC
  Administered 2024-11-19: 2 via ORAL
  Filled 2024-11-18: qty 2

## 2024-11-18 MED ORDER — SIMETHICONE 80 MG PO CHEW: 80.0000 mg | CHEWABLE_TABLET | ORAL | Status: DC | PRN

## 2024-11-18 MED ORDER — ONDANSETRON HCL 4 MG PO TABS: 4.0000 mg | ORAL_TABLET | ORAL | Status: DC | PRN

## 2024-11-18 MED ORDER — PRENATAL MULTIVITAMIN CH
1.0000 | ORAL_TABLET | Freq: Every day | ORAL | Status: DC
  Administered 2024-11-18 – 2024-11-19 (×2): 1 via ORAL
  Filled 2024-11-18 (×2): qty 1

## 2024-11-18 MED ORDER — TERBUTALINE SULFATE 1 MG/ML IJ SOLN: 0.2500 mg | Freq: Once | INTRAMUSCULAR | Status: DC | PRN

## 2024-11-18 MED ORDER — OXYTOCIN-SODIUM CHLORIDE 30-0.9 UT/500ML-% IV SOLN
1.0000 m[IU]/min | INTRAVENOUS | Status: DC
  Administered 2024-11-18: 2 m[IU]/min via INTRAVENOUS
  Filled 2024-11-18: qty 500

## 2024-11-18 MED ORDER — LIDOCAINE HCL (PF) 1 % IJ SOLN
INTRAMUSCULAR | Status: DC | PRN
  Administered 2024-11-18: 5 mL via EPIDURAL

## 2024-11-18 MED ORDER — FENTANYL-BUPIVACAINE-NACL 0.5-0.125-0.9 MG/250ML-% EP SOLN
12.0000 mL/h | EPIDURAL | Status: DC | PRN
  Administered 2024-11-18: 12 mL/h via EPIDURAL
  Filled 2024-11-18: qty 250

## 2024-11-18 MED ORDER — ACETAMINOPHEN 325 MG PO TABS: 650.0000 mg | ORAL_TABLET | ORAL | Status: DC | PRN

## 2024-11-18 MED ORDER — DIPHENHYDRAMINE HCL 50 MG/ML IJ SOLN: 12.5000 mg | INTRAMUSCULAR | Status: DC | PRN

## 2024-11-18 MED ORDER — EPHEDRINE 5 MG/ML INJ: 10.0000 mg | INTRAVENOUS | Status: DC | PRN

## 2024-11-18 MED ORDER — IBUPROFEN 600 MG PO TABS
600.0000 mg | ORAL_TABLET | Freq: Four times a day (QID) | ORAL | Status: DC
  Administered 2024-11-18 – 2024-11-19 (×4): 600 mg via ORAL
  Filled 2024-11-18 (×4): qty 1

## 2024-11-18 MED ORDER — LACTATED RINGERS IV SOLN
500.0000 mL | Freq: Once | INTRAVENOUS | Status: AC
  Administered 2024-11-18: 500 mL via INTRAVENOUS

## 2024-11-18 MED ORDER — DIBUCAINE (PERIANAL) 1 % EX OINT: 1.0000 | TOPICAL_OINTMENT | CUTANEOUS | Status: DC | PRN

## 2024-11-18 MED ORDER — PHENYLEPHRINE 80 MCG/ML (10ML) SYRINGE FOR IV PUSH (FOR BLOOD PRESSURE SUPPORT)
80.0000 ug | PREFILLED_SYRINGE | INTRAVENOUS | Status: DC | PRN
  Filled 2024-11-18: qty 10

## 2024-11-18 MED ORDER — WITCH HAZEL-GLYCERIN EX PADS: 1.0000 | MEDICATED_PAD | CUTANEOUS | Status: DC | PRN

## 2024-11-18 MED ORDER — BENZOCAINE-MENTHOL 20-0.5 % EX AERO: 1.0000 | INHALATION_SPRAY | CUTANEOUS | Status: DC | PRN

## 2024-11-18 MED ORDER — TETANUS-DIPHTH-ACELL PERTUSSIS 5-2-15.5 LF-MCG/0.5 IM SUSP: 0.5000 mL | Freq: Once | INTRAMUSCULAR | Status: DC

## 2024-11-18 MED ORDER — OXYCODONE HCL 5 MG PO TABS: 5.0000 mg | ORAL_TABLET | ORAL | Status: DC | PRN

## 2024-11-18 MED ORDER — PHENYLEPHRINE 80 MCG/ML (10ML) SYRINGE FOR IV PUSH (FOR BLOOD PRESSURE SUPPORT): 80.0000 ug | PREFILLED_SYRINGE | INTRAVENOUS | Status: DC | PRN

## 2024-11-18 NOTE — Discharge Summary (Signed)
 Postpartum Discharge Summary     Patient Name: Jasmine Decker DOB: 11-01-1996 MRN: [id number]  Date of admission: 11/17/2024 Delivery date:11/18/2024 Delivering provider: JOMARIE CAMPI A Date of discharge: 11/19/2024  Admitting diagnosis: Labor and delivery, indication for care [O75.9] Intrauterine pregnancy: [redacted]w[redacted]d     Secondary diagnosis:  Active Problems:   Abnormal Pap smear of cervix   SVD (spontaneous vaginal delivery)  Additional problems:     Discharge diagnosis: Term Pregnancy Delivered                                              Post partum procedures:n/a Augmentation: Pitocin  and Cytotec  Complications: None  Hospital course: Induction of Labor With Vaginal Delivery   28 y.o. yo G3P3003 at [redacted]w[redacted]d was admitted to the hospital 11/17/2024 for induction of labor.  Indication for induction: LGA.  Patient had an labor course complicated by none Membrane Rupture Time/Date: 4:21 AM,11/18/2024  Delivery Method:Vaginal, Spontaneous Operative Delivery:N/A Episiotomy: None Lacerations:  None Details of delivery can be found in separate delivery note.  Patient had a postpartum course complicated by mild blood loss anemia, started on PO iron  supplementation. Patient is discharged home 11/19/2024.  Newborn Data: Birth date:11/18/2024 Birth time:12:30 PM Gender:Female Living status:Living Apgars:9 ,9  Weight:3540 g  Magnesium Sulfate received: No BMZ received: No Rhophylac:N/A MMR:N/A, immune T-DaP:Given prenatally Flu: Yes RSV Vaccine received: Yes Transfusion:No  Immunizations received: Immunization History  Administered Date(s) Administered    sv, Bivalent, Protein Subunit Rsvpref,pf (Abrysvo) 10/06/2024   BCG 10/26/1998   DTaP 11/23/1998, 01/24/1999, 04/02/1999, 09/24/2000, 11/07/2002   HIB (PRP-OMP) 11/23/1998, 01/24/1999, 04/02/1999   HPV Quadrivalent 10/11/2009, 12/27/2009, 07/22/2010   Hepatitis A 12/06/2003, 07/11/2005   Hepatitis B 11/24/1999,  09/24/2000, 12/06/2003   IPV 11/23/1998, 01/24/1999, 04/02/1999, 12/06/1999, 12/06/2003   Influenza, Seasonal, Injecte, Preservative Fre 10/06/2024   Influenza,inj,Quad PF,6+ Mos 04/15/2016   Influenza,inj,quad, With Preservative 12/27/2014   Influenza-Unspecified 10/16/2004, 10/11/2009, 09/09/2010, 10/13/2011, 10/20/2013   MMR 11/15/1999, 12/06/2003, 06/07/2022   Meningococcal Conjugate 10/11/2009, 12/27/2014   Td 10/11/2009   Tdap 10/11/2009, 03/28/2022, 10/06/2024   Unspecified SARS-COV-2 Vaccination 07/24/2021, 09/04/2021   Varicella 01/03/2014, 03/06/2014    Physical exam  Vitals:   11/18/24 1528 11/18/24 1950 11/18/24 2342 11/19/24 0330  BP: 128/75 129/73 134/67 130/69  Pulse: 86 66 78 70  Resp: 18 18 17 18   Temp: 99.3 F (37.4 C) 98.4 F (36.9 C) 98.4 F (36.9 C) 97.7 F (36.5 C)  TempSrc: Oral Oral Oral Oral  SpO2: 100% 98% 99% 100%  Weight:      Height:       General: alert, cooperative, and no distress Lochia: appropriate Uterine Fundus: firm Incision: N/A DVT Evaluation: No evidence of DVT seen on physical exam. No significant calf/ankle edema. Labs: Lab Results  Component Value Date   WBC 12.1 (H) 11/19/2024   HGB 9.8 (L) 11/19/2024   HCT 30.3 (L) 11/19/2024   MCV 83.0 11/19/2024   PLT 338 11/19/2024      Latest Ref Rng & Units 05/06/2022   10:29 AM  CMP  Glucose 65 - 139 mg/dL 84   BUN 7 - 25 mg/dL 8   Creatinine 9.49 - 9.03 mg/dL 9.61   Sodium 864 - 853 mmol/L 135   Potassium 3.5 - 5.3 mmol/L 4.6   Chloride 98 - 110 mmol/L 105   CO2 20 - 32 mmol/L 17  Calcium 8.6 - 10.2 mg/dL 9.2   Total Protein 6.1 - 8.1 g/dL 7.0   Total Bilirubin 0.2 - 1.2 mg/dL 0.7   AST 10 - 30 U/L 65   ALT 6 - 29 U/L 34    Edinburgh Score:    11/18/2024    2:14 PM  Edinburgh Postnatal Depression Scale Screening Tool  I have been able to laugh and see the funny side of things. --   No data recorded  After visit meds:  Allergies as of 11/19/2024   No Known  Allergies      Medication List     STOP taking these medications    ketoconazole  2 % cream Commonly known as: NIZORAL    nystatin  powder Commonly known as: MYCOSTATIN /NYSTOP        TAKE these medications    acetaminophen  325 MG tablet Commonly known as: Tylenol  Take 2 tablets (650 mg total) by mouth every 4 (four) hours as needed (for pain scale < 4).   ferrous sulfate  325 (65 FE) MG tablet Take 1 tablet (325 mg total) by mouth every other day.   ibuprofen  600 MG tablet Commonly known as: ADVIL  Take 1 tablet (600 mg total) by mouth every 6 (six) hours.   multivitamin-prenatal 27-0.8 MG Tabs tablet Take 1 tablet by mouth daily at 12 noon.   polyethylene glycol powder 17 GM/SCOOP powder Commonly known as: GLYCOLAX /MIRALAX  Take 17 g by mouth daily as needed.         Discharge home in stable condition Infant Feeding: Breast Infant Disposition:home with mother Discharge instruction: per After Visit Summary and Postpartum booklet. Activity: Advance as tolerated. Pelvic rest for 6 weeks.  Diet: routine diet Future Appointments:No future appointments. Follow up Visit:   Please schedule this patient for a In person postpartum visit in 6 weeks with the following provider: Any provider. Additional Postpartum F/U:Postpartum Depression checkup  High risk pregnancy complicated by: LGA Delivery mode:  Vaginal, Spontaneous Anticipated Birth Control:  Vasectomy  Message sent 11/18/24  11/19/2024 Donnice CHRISTELLA Carolus, MD

## 2024-11-18 NOTE — Lactation Note (Signed)
 This note was copied from a baby's chart. Lactation Consultation Note  Patient Name: Jasmine Decker Unijb'd Date: 11/18/2024 Age:26 hours   P3, MOB plans to solely formula feed infant.   Maternal Data    Feeding Nipple Type: Slow - flow  LATCH Score                    Lactation Tools Discussed/Used    Interventions    Discharge    Consult Status Consult Status: Complete    [name] 11/18/2024, 6:22 PM

## 2024-11-18 NOTE — Anesthesia Procedure Notes (Signed)
 Epidural Patient location during procedure: OB Start time: 11/18/2024 5:45 AM End time: 11/18/2024 6:15 AM  Staffing Anesthesiologist: Jefm Garnette LABOR, MD Performed: anesthesiologist   Preanesthetic Checklist Completed: patient identified, IV checked, site marked, risks and benefits discussed, surgical consent, monitors and equipment checked, pre-op evaluation and timeout performed  Epidural Patient position: sitting Prep: DuraPrep and site prepped and draped Patient monitoring: continuous pulse ox and blood pressure Approach: midline Location: L3-L4 Injection technique: LOR air  Needle:  Needle type: Tuohy  Needle gauge: 17 G Needle length: 9 cm and 9 Needle insertion depth: 8 cm Catheter type: closed end flexible Catheter size: 19 Gauge Catheter at skin depth: 15 cm Test dose: negative  Assessment Events: blood not aspirated, no cerebrospinal fluid, injection not painful, no injection resistance, no paresthesia and negative IV test  Additional Notes Patient identified. Risks/Benefits/Options discussed with patient including but not limited to bleeding, infection, nerve damage, paralysis, failed block, incomplete pain control, headache, blood pressure changes, nausea, vomiting, reactions to medication both or allergic, itching and postpartum back pain. Confirmed with bedside nurse the patient's most recent platelet count. Confirmed with patient that they are not currently taking any anticoagulation, have any bleeding history or any family history of bleeding disorders. Patient expressed understanding and wished to proceed. All questions were answered. Sterile technique was used throughout the entire procedure. Please see nursing notes for vital signs. Test dose was given through epidural needle and negative prior to continuing to dose epidural or start infusion. Warning signs of high block given to the patient including shortness of breath, tingling/numbness in hands, complete  motor block, or any concerning symptoms with instructions to call for help. Patient was given instructions on fall risk and not to get out of bed. All questions and concerns addressed with instructions to call with any issues.  3 Attempt (S) . Patient tolerated procedure well.

## 2024-11-18 NOTE — Progress Notes (Signed)
 Labor Progress Note Jackye Purdie is a 28 y.o. G3P2002 at [redacted]w[redacted]d presented for IOL for LGA S: Doing well. No concerns at this time.  O:  BP 129/66   Pulse 74   Temp 98.4 F (36.9 C) (Oral)   Resp 16   Ht 5' 3 (1.6 m)   Wt 122.4 kg   LMP 02/08/2024   BMI 47.81 kg/m  EFM: 145bpm/mod variability/accels present, early decels  CVE: Dilation: 8.5 Effacement (%): 100 Cervical Position: Posterior Station: -1, -2 Presentation: Vertex Exam by:: hk   A&P: 28 y.o. [id number] [redacted]w[redacted]d for IOL for LGA #Labor: Progressing well. SROM 0421. EFM with early decels which was dicussed with attending. Continue to monitor. #Pain: Epidural #FWB: Cat II #GBS negative  Davion Meara, DO 11:37 AM

## 2024-11-18 NOTE — Anesthesia Postprocedure Evaluation (Signed)
 Anesthesia Post Note  Patient: Jasmine Decker  Procedure(s) Performed: AN AD HOC LABOR EPIDURAL     Patient location during evaluation: Mother Baby Anesthesia Type: Epidural Level of consciousness: awake and alert Pain management: pain level controlled Vital Signs Assessment: post-procedure vital signs reviewed and stable Respiratory status: spontaneous breathing, nonlabored ventilation and respiratory function stable Cardiovascular status: stable Postop Assessment: no headache, no backache and epidural receding Anesthetic complications: no   No notable events documented.  Last Vitals:  Vitals:   11/18/24 1347 11/18/24 1414  BP: 120/74 120/66  Pulse: 81 81  Resp:  20  Temp:  37.3 C  SpO2:  100%    Last Pain:  Vitals:   11/18/24 1414  TempSrc: Oral  PainSc: 0-No pain   Pain Goal:                   Rosamae Rocque

## 2024-11-19 LAB — CBC
HCT: 30.3 % — ABNORMAL LOW (ref 36.0–46.0)
Hemoglobin: 9.8 g/dL — ABNORMAL LOW (ref 12.0–15.0)
MCH: 26.8 pg (ref 26.0–34.0)
MCHC: 32.3 g/dL (ref 30.0–36.0)
MCV: 83 fL (ref 80.0–100.0)
Platelets: 338 K/uL (ref 150–400)
RBC: 3.65 MIL/uL — ABNORMAL LOW (ref 3.87–5.11)
RDW: 14.3 % (ref 11.5–15.5)
WBC: 12.1 K/uL — ABNORMAL HIGH (ref 4.0–10.5)
nRBC: 0 % (ref 0.0–0.2)

## 2024-11-19 MED ORDER — ACETAMINOPHEN 325 MG PO TABS: 650.0000 mg | ORAL_TABLET | ORAL | 0 refills | Status: AC | PRN

## 2024-11-19 MED ORDER — FERROUS SULFATE 325 (65 FE) MG PO TABS: 325.0000 mg | ORAL_TABLET | ORAL | 0 refills | Status: AC

## 2024-11-19 MED ORDER — IBUPROFEN 600 MG PO TABS: 600.0000 mg | ORAL_TABLET | Freq: Four times a day (QID) | ORAL | 0 refills | Status: AC

## 2024-11-19 MED ORDER — FERROUS SULFATE 325 (65 FE) MG PO TABS
325.0000 mg | ORAL_TABLET | ORAL | Status: DC
  Administered 2024-11-19: 325 mg via ORAL
  Filled 2024-11-19: qty 1

## 2024-11-19 MED ORDER — POLYETHYLENE GLYCOL 3350 17 GM/SCOOP PO POWD: 17.0000 g | Freq: Every day | ORAL | 1 refills | Status: AC | PRN

## 2024-11-28 ENCOUNTER — Telehealth (HOSPITAL_COMMUNITY): Payer: Self-pay

## 2024-11-28 NOTE — Telephone Encounter (Signed)
 11/28/2024 0911  Name: Jasmine Decker MRN: [id number] DOB: 01-24-1996  Reason for Call:  Transition of Care Hospital Discharge Call  Contact Status: Patient Contact Status: Message  Language assistant needed:          Follow-Up Questions:    Van Postnatal Depression Scale:  In the Past 7 Days:    PHQ2-9 Depression Scale:     Discharge Follow-up:    Post-discharge interventions: NA  Signature  Rosaline Deretha PEAK

## 2025-01-02 ENCOUNTER — Ambulatory Visit: Payer: Self-pay | Admitting: Obstetrics and Gynecology

## 2025-01-23 ENCOUNTER — Ambulatory Visit: Admitting: Obstetrics and Gynecology
